# Patient Record
Sex: Male | Born: 1950
Health system: Southern US, Community
[De-identification: ages and names within clinical notes are randomized; demographics above are authoritative.]

## PROBLEM LIST (undated history)

## (undated) DIAGNOSIS — A150 Tuberculosis of lung: Secondary | ICD-10-CM

## (undated) DIAGNOSIS — I10 Essential (primary) hypertension: Secondary | ICD-10-CM

## (undated) HISTORY — DX: Tuberculosis of lung: A15.0

## (undated) HISTORY — DX: Essential (primary) hypertension: I10

---

## 2000-06-23 ENCOUNTER — Emergency Department (HOSPITAL_COMMUNITY): Admission: EM | Admit: 2000-06-23 | Discharge: 2000-06-24 | Payer: Self-pay

## 2000-06-24 ENCOUNTER — Encounter: Payer: Self-pay | Admitting: Emergency Medicine

## 2008-03-14 DIAGNOSIS — A150 Tuberculosis of lung: Secondary | ICD-10-CM

## 2008-03-14 HISTORY — DX: Tuberculosis of lung: A15.0

## 2012-03-07 ENCOUNTER — Ambulatory Visit (INDEPENDENT_AMBULATORY_CARE_PROVIDER_SITE_OTHER): Payer: BC Managed Care – PPO | Admitting: Family Medicine

## 2012-03-07 VITALS — BP 114/71 | HR 66 | Temp 98.6°F | Resp 18 | Ht 62.75 in | Wt 116.0 lb

## 2012-03-07 DIAGNOSIS — R5383 Other fatigue: Secondary | ICD-10-CM

## 2012-03-07 LAB — POCT CBC
Granulocyte percent: 51.1 %G (ref 37–80)
HCT, POC: 43.2 % — AB (ref 43.5–53.7)
Hemoglobin: 13.8 g/dL — AB (ref 14.1–18.1)
Lymph, poc: 3.3 (ref 0.6–3.4)
MCH, POC: 26.4 pg — AB (ref 27–31.2)
MCHC: 31.9 g/dL (ref 31.8–35.4)
MCV: 82.6 fL (ref 80–97)
MID (cbc): 0.5 (ref 0–0.9)
MPV: 10.1 fL (ref 0–99.8)
POC Granulocyte: 4 (ref 2–6.9)
POC LYMPH PERCENT: 42.3 %L (ref 10–50)
POC MID %: 6.6 %M (ref 0–12)
Platelet Count, POC: 246 10*3/uL (ref 142–424)
RBC: 5.23 M/uL (ref 4.69–6.13)
RDW, POC: 14 %
WBC: 7.9 10*3/uL (ref 4.6–10.2)

## 2012-03-07 LAB — POCT GLYCOSYLATED HEMOGLOBIN (HGB A1C): Hemoglobin A1C: 5.5

## 2012-03-07 NOTE — Progress Notes (Signed)
Patient Name: Jorge Richards Date of Birth: [birth year] Medical Record Number: [id number] Gender: male Date of Encounter: 03/07/2012  History of Present Illness:  Jorge Richards is a 60 y.o. very pleasant male patient who presents with the following:  Last seen here about a year ago, at which time he was noted to be non- compliant with his HTN medications, and also to be a heavy drinker and smoker. He has noted fatigue and low- energy for about 3 weeks.  No any other symptoms such as fever, ST, etc. He has not been losing weight.  He is not taking his BP medication right now. No blood in stool or melena.  He has never had a colonoscopy.   Not much appetite.  He does not feel sad or depressed  He has actually quit drinking since his last visit here!  He does continue to smoke.  Here today with his daughter who helped with interpretation.   There is no problem list on file for this patient.  No past medical history on file. No past surgical history on file. History  Substance Use Topics  . Smoking status: Current Everyday Smoker  . Smokeless tobacco: Not on file  . Alcohol Use: Not on file   No family history on file. No Known Allergies  Medication list has been reviewed and updated.  Prior to Admission medications   Not on File    Review of Systems:  As per HPI- otherwise negative.   Physical Examination: Filed Vitals:   03/07/12 0924  BP: 114/71  Pulse: 66  Temp: 98.6 F (37 C)  Resp: 18   Filed Vitals:   03/07/12 0924  Height: 5' 2.75" (1.594 m)  Weight: 116 lb (52.617 kg)   Body mass index is 20.71 kg/(m^2). Ideal Body Weight: Weight in (lb) to have BMI = 25: 139.7   GEN: WDWN, NAD, Non-toxic, Alert and responsive to questions, thin build HEENT: Atraumatic, Normocephalic. Neck supple. No masses, No LAD.  TM and oropharynx wnl Ears and Nose: No external deformity. CV: RRR, No M/G/R. No JVD. No thrill. No extra heart sounds. PULM: CTA B, no wheezes,  crackles, rhonchi. No retractions. No resp. distress. No accessory muscle use. ABD: S, NT, ND, +BS. No rebound. No HSM. EXTR: No c/c/e NEURO Normal gait.  PSYCH: Normally interactive.  Not depressed or anxious appearing.  Calm demeanor.  Results for orders placed in visit on 03/07/12  POCT CBC      Component Value Range   WBC 7.9  4.6 - 10.2 K/uL   Lymph, poc 3.3  0.6 - 3.4   POC LYMPH PERCENT 42.3  10 - 50 %L   MID (cbc) 0.5  0 - 0.9   POC MID % 6.6  0 - 12 %M   POC Granulocyte 4.0  2 - 6.9   Granulocyte percent 51.1  37 - 80 %G   RBC 5.23  4.69 - 6.13 M/uL   Hemoglobin 13.8 (*) 14.1 - 18.1 g/dL   HCT, POC 40.9 (*) 81.1 - 53.7 %   MCV 82.6  80 - 97 fL   MCH, POC 26.4 (*) 27 - 31.2 pg   MCHC 31.9  31.8 - 35.4 g/dL   RDW, POC 91.4     Platelet Count, POC 246  142 - 424 K/uL   MPV 10.1  0 - 99.8 fL  POCT GLYCOSYLATED HEMOGLOBIN (HGB A1C)      Component Value Range   Hemoglobin A1C 5.5  Assessment and Plan: 1. Fatigue  POCT CBC, POCT glycosylated hemoglobin (Hb A1C), Comprehensive metabolic panel, PSA, TSH, Vitamin D, 25-hydroxy   Await other labs as above.  BP is certainly now normal, so we can continue to hold his HTN medication.  Again encouraged him to get a screening colonoscopy and gave community resources.  Pointed out that anemia can be a warning sign of colon cancer. Will plan further follow- up pending labs. congratulated him on giving up alcohol- he is very pleased about this.   Abbe Amsterdam, MD

## 2012-03-08 ENCOUNTER — Encounter: Payer: Self-pay | Admitting: Family Medicine

## 2012-03-08 LAB — COMPREHENSIVE METABOLIC PANEL
ALT: 12 U/L (ref 0–53)
AST: 16 U/L (ref 0–37)
Albumin: 4.4 g/dL (ref 3.5–5.2)
Alkaline Phosphatase: 52 U/L (ref 39–117)
BUN: 13 mg/dL (ref 6–23)
CO2: 27 mEq/L (ref 19–32)
Calcium: 9.3 mg/dL (ref 8.4–10.5)
Chloride: 107 mEq/L (ref 96–112)
Creat: 0.99 mg/dL (ref 0.50–1.35)
Glucose, Bld: 87 mg/dL (ref 70–99)
Potassium: 4.5 mEq/L (ref 3.5–5.3)
Sodium: 141 mEq/L (ref 135–145)
Total Bilirubin: 0.5 mg/dL (ref 0.3–1.2)
Total Protein: 7 g/dL (ref 6.0–8.3)

## 2012-03-08 LAB — PSA: PSA: 0.34 ng/mL (ref <=4.00)

## 2012-03-08 LAB — TSH: TSH: 1.394 u[IU]/mL (ref 0.350–4.500)

## 2012-03-08 LAB — VITAMIN D 25 HYDROXY (VIT D DEFICIENCY, FRACTURES): Vit D, 25-Hydroxy: 24 ng/mL — ABNORMAL LOW (ref 30–89)

## 2012-05-20 ENCOUNTER — Ambulatory Visit (INDEPENDENT_AMBULATORY_CARE_PROVIDER_SITE_OTHER): Payer: BC Managed Care – PPO | Admitting: Family Medicine

## 2012-05-20 VITALS — BP 130/82 | HR 65 | Temp 98.1°F | Resp 16 | Ht 62.5 in | Wt 115.0 lb

## 2012-05-20 DIAGNOSIS — R002 Palpitations: Secondary | ICD-10-CM

## 2012-05-20 DIAGNOSIS — Z0000 Encounter for general adult medical examination without abnormal findings: Secondary | ICD-10-CM

## 2012-05-20 DIAGNOSIS — N400 Enlarged prostate without lower urinary tract symptoms: Secondary | ICD-10-CM

## 2012-05-20 LAB — LIPID PANEL
Cholesterol: 175 mg/dL (ref 0–200)
HDL: 55 mg/dL (ref >39)
LDL Cholesterol: 106 mg/dL — ABNORMAL HIGH (ref 0–99)
Total CHOL/HDL Ratio: 3.2 Ratio
Triglycerides: 71 mg/dL (ref <150)
VLDL: 14 mg/dL (ref 0–40)

## 2012-05-20 LAB — IFOBT (OCCULT BLOOD): IFOBT: NEGATIVE

## 2012-05-20 NOTE — Progress Notes (Signed)
History: 61 year old gentleman who works at [employer] who is here for his physical examination. He has no major acute medical complaint plates. He is aware some palpitations at nighttime when he lies down. He was here in June and had some of his labs at that time. His company requires and annual physical examination with every five-year lipid exam and a fecal occult blood testing annually.  Medications: None Current medication allergies: None Has history: Unremarkable Past surgical history: None   Family history: Parents are deceased. Some of his daughters are still in [place].  Social history: Married, lives with his wife. He has been in the [place] since the 1970s when he had to flee his home country. To settling [place] about 30 years ago. He has a job as a Psychologist, occupational. He still smokes one pack cigarettes a day. Denies alcohol use.  Review of systems: Constitutional: Says he has lost about 5 pounds, and his appetite is not what it used to be. HEENT: Negative Eyes: Negative. Wears glasses Respiratory: Unremarkable Cardiovascular: Palpitations when he lies down at night. Otherwise asymptomatic. No chest pain. GI: Unremarkable GU: Unremarkable Musculoskeletal: Unremarkable Dermatologic: Unremarkable Neurologic: Unremarkable Hematologic: Unremarkable Psychiatric: Unremarkable Endocrine: Unremarkable   Physical examination: Well-developed well-nourished man in no acute distress. HEENT normal. Eyes PERRLA. EOMs intact. Throat clear. Neck supple without significant nodes. No carotid bruits. Chest clear. Heart regular without murmurs hurt a single early beat.. Abdomen soft without masses tenderness. Normal male external genitalia. Uncircumcised. Testes. Digital rectal exam reveals prostate gland to be a little bit large, more on the left than on the right. Extremities unremarkable. Skin unremarkable.  Assessment: Normal physical examination Mild BPH Palpitations  Plan: Reviewed  his labs from June Check Hemoccult Check EKG  Pvc's, otherwise okay

## 2012-05-20 NOTE — Patient Instructions (Signed)
Normal physical examination. No medications needed at this time. If you feel like you're continuing to lose weight please return.  Quit smoking as discussed.

## 2012-05-21 ENCOUNTER — Encounter: Payer: Self-pay | Admitting: Radiology

## 2013-06-05 ENCOUNTER — Ambulatory Visit (INDEPENDENT_AMBULATORY_CARE_PROVIDER_SITE_OTHER): Payer: BC Managed Care – PPO | Admitting: Physician Assistant

## 2013-06-05 VITALS — BP 169/86 | HR 61 | Temp 98.0°F | Resp 18 | Ht 63.5 in | Wt 124.0 lb

## 2013-06-05 DIAGNOSIS — Z1211 Encounter for screening for malignant neoplasm of colon: Secondary | ICD-10-CM

## 2013-06-05 DIAGNOSIS — Z23 Encounter for immunization: Secondary | ICD-10-CM

## 2013-06-05 DIAGNOSIS — Z125 Encounter for screening for malignant neoplasm of prostate: Secondary | ICD-10-CM

## 2013-06-05 DIAGNOSIS — Z0000 Encounter for general adult medical examination without abnormal findings: Secondary | ICD-10-CM

## 2013-06-05 DIAGNOSIS — Z8611 Personal history of tuberculosis: Secondary | ICD-10-CM

## 2013-06-05 LAB — POCT CBC
Granulocyte percent: 50.8 %G (ref 37–80)
HCT, POC: 39.9 % — AB (ref 43.5–53.7)
Hemoglobin: 12.8 g/dL — AB (ref 14.1–18.1)
Lymph, poc: 3.4 (ref 0.6–3.4)
MCH, POC: 27.5 pg (ref 27–31.2)
MCHC: 32.1 g/dL (ref 31.8–35.4)
MCV: 85.6 fL (ref 80–97)
MID (cbc): 0.7 (ref 0–0.9)
MPV: 9 fL (ref 0–99.8)
POC Granulocyte: 4.2 (ref 2–6.9)
POC LYMPH PERCENT: 41.2 %L (ref 10–50)
POC MID %: 8 %M (ref 0–12)
Platelet Count, POC: 246 10*3/uL (ref 142–424)
RBC: 4.66 M/uL — AB (ref 4.69–6.13)
RDW, POC: 14.6 %
WBC: 8.3 10*3/uL (ref 4.6–10.2)

## 2013-06-05 NOTE — Patient Instructions (Signed)

## 2013-06-05 NOTE — Progress Notes (Signed)
Subjective:    Patient ID: Jorge Richards, male    DOB: 03-31-1951, 62 y.o.   MRN: [id number]  HPI This 62 y.o. male presents for Annual Wellness Exam.  Colonoscopy several years ago with Dr. Loreta Ave. Formerly smoked and drank alcohol, but has quit both. CMET, PSA and lipids were normal 04/27/2011. Mildly anemic then (Hgb 13.6, Hct 42.5), normal in 09/2008.   Past Medical History  Diagnosis Date   TB s/p INH 03/2008  . Hypertension     History reviewed. No pertinent past surgical history.  Prior to Admission medications   Not on File    No Known Allergies  History   Social History  . Marital Status: Married    Spouse Name: Phone Hedglin    Number of Children: 3  . Years of Education: 12   Occupational History  . 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 Smoking status: Former Games developer  . Smokeless tobacco: Never Used  . Alcohol Use: No  . Drug Use: No  . Sexual Activity: Yes    Partners: Female   Other Topics Concern  . Not on file   Social History Narrative   Originally from [place].  Came to the [place] in 1982.  Lives with his wife and a daughter.  Another daughter lives in [place], [place].  Their son lives in [place].    History reviewed. No pertinent family history.    Review of Systems  Constitutional: Negative.   HENT: Positive for neck pain (occasionally, when he does work in the garden and spends a lot of time leaning over). Negative for hearing loss, ear pain, nosebleeds, congestion, sore throat, facial swelling, rhinorrhea, sneezing, drooling, mouth sores, trouble swallowing, neck stiffness, dental problem, voice change, postnasal drip, sinus pressure, tinnitus and ear discharge.   Eyes: Negative.   Respiratory: Negative.   Cardiovascular: Negative.   Gastrointestinal: Negative.   Endocrine: Negative.   Genitourinary: Negative.   Skin: Negative.   Allergic/Immunologic: Negative.   Neurological: Negative.   Psychiatric/Behavioral:  Negative.        Objective:   Physical Exam  Vitals reviewed. Constitutional: He is oriented to person, place, and time. Vital signs are normal. He appears well-developed and well-nourished. He is active and cooperative.  Non-toxic appearance. He does not have a sickly appearance. He does not appear ill. No distress.  HENT:  Head: Normocephalic and atraumatic.  Right Ear: Hearing, tympanic membrane, external ear and ear canal normal.  Left Ear: Hearing, tympanic membrane, external ear and ear canal normal.  Nose: Nose normal.  Mouth/Throat: Uvula is midline, oropharynx is clear and moist and mucous membranes are normal. He does not have dentures. No oral lesions. No trismus in the jaw. Normal dentition. No dental abscesses, edematous, lacerations or dental caries.  Eyes: Conjunctivae, EOM and lids are normal. Pupils are equal, round, and reactive to light. Right eye exhibits no discharge. Left eye exhibits no discharge. No scleral icterus.  Fundoscopic exam:      The right eye shows no arteriolar narrowing, no AV nicking, no exudate, no hemorrhage and no papilledema.       The left eye shows no arteriolar narrowing, no AV nicking, no exudate, no hemorrhage and no papilledema.  Neck: Normal range of motion, full passive range of motion without pain and phonation normal. Neck supple. No spinous process tenderness and no muscular tenderness present. No rigidity. No tracheal deviation, no edema, no erythema and normal range of motion present.  No thyromegaly present.  Cardiovascular: Normal rate, regular rhythm, S1 normal, S2 normal, normal heart sounds, intact distal pulses and normal pulses.  Exam reveals no gallop and no friction rub.   No murmur heard. Pulmonary/Chest: Effort normal and breath sounds normal. No respiratory distress. He has no wheezes. He has no rales.  Abdominal: Soft. Normal appearance and bowel sounds are normal. He exhibits no distension and no mass. There is no  hepatosplenomegaly. There is no tenderness. There is no rebound and no guarding. No hernia. Hernia confirmed negative in the right inguinal area and confirmed negative in the left inguinal area.  Genitourinary: Rectum normal, prostate normal, testes normal and penis normal. Guaiac negative stool. Uncircumcised. No phimosis, paraphimosis, hypospadias, penile erythema or penile tenderness. No discharge found.  Musculoskeletal: Normal range of motion. He exhibits no edema and no tenderness.       Right shoulder: Normal.       Left shoulder: Normal.       Right elbow: Normal.      Left elbow: Normal.       Right wrist: Normal.       Left wrist: Normal.       Right hip: Normal.       Left hip: Normal.       Right knee: Normal.       Left knee: Normal.       Right ankle: Normal. Achilles tendon normal.       Left ankle: Normal. Achilles tendon normal.       Cervical back: Normal. He exhibits normal range of motion, no tenderness, no bony tenderness, no swelling, no edema, no deformity, no laceration, no pain, no spasm and normal pulse.       Thoracic back: Normal.       Lumbar back: Normal.       Right upper arm: Normal.       Left upper arm: Normal.       Right forearm: Normal.       Left forearm: Normal.       Right hand: Normal.       Left hand: Normal.       Right upper leg: Normal.       Left upper leg: Normal.       Right lower leg: Normal.       Left lower leg: Normal.       Right foot: Normal.       Left foot: Normal.  Lymphadenopathy:       Head (right side): No submental, no submandibular, no tonsillar, no preauricular, no posterior auricular and no occipital adenopathy present.       Head (left side): No submental, no submandibular, no tonsillar, no preauricular, no posterior auricular and no occipital adenopathy present.    He has no cervical adenopathy.       Right: No inguinal and no supraclavicular adenopathy present.       Left: No inguinal and no supraclavicular  adenopathy present.  Neurological: He is alert and oriented to person, place, and time. He has normal strength and normal reflexes. He displays no tremor. No cranial nerve deficit. He exhibits normal muscle tone. Coordination and gait normal.  Skin: Skin is warm, dry and intact. No abrasion, no ecchymosis, no laceration, no lesion and no rash noted. He is not diaphoretic. No cyanosis or erythema. No pallor. Nails show no clubbing.  Psychiatric: He has a normal mood and affect. His speech is normal and behavior is  normal. Judgment and thought content normal. Cognition and memory are normal.    Results for orders placed in visit on 06/05/13  POCT CBC      Result Value Range   WBC 8.3  4.6 - 10.2 K/uL   Lymph, poc 3.4  0.6 - 3.4   POC LYMPH PERCENT 41.2  10 - 50 %L   MID (cbc) 0.7  0 - 0.9   POC MID % 8.0  0 - 12 %M   POC Granulocyte 4.2  2 - 6.9   Granulocyte percent 50.8  37 - 80 %G   RBC 4.66 (*) 4.69 - 6.13 M/uL   Hemoglobin 12.8 (*) 14.1 - 18.1 g/dL   HCT, POC 10.2 (*) 72.5 - 53.7 %   MCV 85.6  80 - 97 fL   MCH, POC 27.5  27 - 31.2 pg   MCHC 32.1  31.8 - 35.4 g/dL   RDW, POC 36.6     Platelet Count, POC 246  142 - 424 K/uL   MPV 9.0  0 - 99.8 fL  IFOBT (OCCULT BLOOD)      Result Value Range   IFOBT Negative         Assessment & Plan:  Routine general medical examination at a health care facility - Plan: POCT CBC, Comprehensive metabolic panel; Age appropriate anticipatory guidance provided.  Need for influenza vaccination - Plan: Flu Vaccine QUAD 36+ mos IM  Screening for colon cancer - Plan: IFOBT POC (occult bld, rslt in office)  Screening for prostate cancer - Plan: PSA  BP is elevated today off HCTZ.  Needs to monitor.  If it is routinely >140/90, will advise he resume treatment.  Await remaining labs.  May need additional evaluation of the anemia, though recent colonoscopy is reassuring.  Fernande Bras, PA-C Physician Assistant-Certified [facility]

## 2013-06-06 LAB — COMPREHENSIVE METABOLIC PANEL
ALT: 15 U/L (ref 0–53)
AST: 16 U/L (ref 0–37)
CO2: 29 mEq/L (ref 19–32)
Calcium: 9.1 mg/dL (ref 8.4–10.5)
Chloride: 106 mEq/L (ref 96–112)
Glucose, Bld: 86 mg/dL (ref 70–99)
Sodium: 140 mEq/L (ref 135–145)
Total Bilirubin: 0.4 mg/dL (ref 0.3–1.2)
Total Protein: 6.9 g/dL (ref 6.0–8.3)

## 2013-06-06 LAB — PSA: PSA: 0.34 ng/mL (ref <=4.00)

## 2013-06-07 ENCOUNTER — Encounter: Payer: Self-pay | Admitting: Physician Assistant

## 2014-06-25 ENCOUNTER — Ambulatory Visit (INDEPENDENT_AMBULATORY_CARE_PROVIDER_SITE_OTHER): Payer: BC Managed Care – PPO

## 2014-06-25 ENCOUNTER — Ambulatory Visit (INDEPENDENT_AMBULATORY_CARE_PROVIDER_SITE_OTHER): Payer: BC Managed Care – PPO | Admitting: Family Medicine

## 2014-06-25 VITALS — BP 128/70 | HR 72 | Temp 98.7°F | Resp 16 | Ht 62.75 in | Wt 125.2 lb

## 2014-06-25 DIAGNOSIS — S43421A Sprain of right rotator cuff capsule, initial encounter: Secondary | ICD-10-CM

## 2014-06-25 DIAGNOSIS — M25511 Pain in right shoulder: Secondary | ICD-10-CM

## 2014-06-25 MED ORDER — HYDROCODONE-ACETAMINOPHEN 5-325 MG PO TABS: 1.0000 | ORAL_TABLET | Freq: Four times a day (QID) | ORAL | Status: DC | PRN

## 2014-06-25 MED ORDER — MELOXICAM 7.5 MG PO TABS: 7.5000 mg | ORAL_TABLET | Freq: Every day | ORAL | Status: DC

## 2014-06-25 NOTE — Progress Notes (Signed)
   Subjective:    Patient ID: Jorge Richards, male    DOB: 12/16/1950, 63 y.o.   MRN: [id number]  Shoulder Pain     GUEST, Loretha StaplerHRIS WARREN, MD  Chief Complaint  Patient presents with  . Shoulder Pain    Right shoulder. 2-3 days. Pt. states it's dull and achy pain. It keeps it up at night.    Patient Active Problem List   Diagnosis Date Noted  . History of TB (tuberculosis)      Prior to Admission medications   Not on File   Medications, allergies, past medical history, surgical history, family history, social history and problem list reviewed and updated.  63 yom with no significant PMH presents with right shoulder pain. Started 2-3 days ago. First noticed pain at front of right shoulder. Cannot remember what he was doing when the pain actually presented, no acute injury he can recall. Pain has been steady since it came on. Painful to move his arm at all. Pain has steadily been 7/10 though sometimes it is 10/10 when he tries to sleep at night. Tylenol has helped a little bit. Left shoulder is fine.   Review of Systems  No chest pain, SOB, HA, dizziness, vision change, N/V, diarrhea, constipation, dysuria, urinary urgency or frequency, myalgias, arthralgias or rash.     Objective:   Physical Exam  Constitutional: He appears well-developed and well-nourished.  Musculoskeletal:       Right shoulder: He exhibits decreased range of motion, tenderness, bony tenderness, pain and decreased strength.       Left shoulder: Normal. He exhibits normal range of motion, no tenderness, no bony tenderness, no swelling and no pain.  Right shoulder without lesions or rashes. TTP over anterior AC joint, bicepital groove. Decreased ROM in front and out to side due to pain. Unable to lift arm overhead due to pain. 5/5 strength at shoulder, elbow, and grip. Normal sensation. 2+ brachialis, radial pulses. Positive Neers test. Positive pain with resisted external rotation. Positive Speed's test. Negative  clunk test.    [facility] reading (PRIMARY) by  Dr. Patsy Lageropland. Findings: Negative.     Assessment & Plan:   [age] yom with no significant PMH presents complaining of 2-3 day history of right shoulder pain.   Shoulder pain, acute, right - Plan: DG Shoulder Right, meloxicam (MOBIC) 7.5 MG tablet, HYDROcodone-acetaminophen (NORCO) 5-325 MG per tablet  Rotator cuff (capsule) sprain, right, initial encounter  -- XR negative for acute abnormality. -- Most likely rotator cuff sprain/tendonitis.  -- Mobic for inflammation/pain -- Norco for pain -- Patient placed in right shoulder sling, instructions given.  -- Note given to stay off work until end of week. -- Ice  -- Patient instructed to return to office or contact office if symptoms aren't improving in 2-3 days. Donnajean Lopes.   Fidelia Cathers M. Shlok Raz, PA-C Physician Assistant-Certified [facility]  06/25/2014 3:15 PM

## 2014-06-25 NOTE — Patient Instructions (Signed)
Your shoulder xray was negative for anything wrong with the bones.  You most likely sprained your rotator cuff muscles.  You will need to rest your shoulder and be off work for 3 days.  You should keep your splint on for the next 3-4 days as much as possible. It is ok to remove it as needed to shower, etc.  Take your mobic as prescribed (7.5 mg once daily) for inflammation and pain.  Take your norco as needed for pain. You can take one tablet every 6 hours as needed for pain. If symptoms aren't improved in the next few days, please return to clinic or call us at the clinic so we can refer you to an orthopedic shoulder specialist.

## 2014-06-27 ENCOUNTER — Ambulatory Visit (INDEPENDENT_AMBULATORY_CARE_PROVIDER_SITE_OTHER): Payer: BC Managed Care – PPO

## 2014-06-27 ENCOUNTER — Ambulatory Visit (INDEPENDENT_AMBULATORY_CARE_PROVIDER_SITE_OTHER): Payer: BC Managed Care – PPO | Admitting: Emergency Medicine

## 2014-06-27 VITALS — BP 148/88 | HR 88 | Temp 98.3°F | Resp 16 | Ht 63.0 in | Wt 124.6 lb

## 2014-06-27 DIAGNOSIS — I10 Essential (primary) hypertension: Secondary | ICD-10-CM

## 2014-06-27 DIAGNOSIS — I459 Conduction disorder, unspecified: Secondary | ICD-10-CM

## 2014-06-27 DIAGNOSIS — Z13220 Encounter for screening for lipoid disorders: Secondary | ICD-10-CM

## 2014-06-27 DIAGNOSIS — Z125 Encounter for screening for malignant neoplasm of prostate: Secondary | ICD-10-CM

## 2014-06-27 DIAGNOSIS — Z0000 Encounter for general adult medical examination without abnormal findings: Secondary | ICD-10-CM

## 2014-06-27 LAB — POCT CBC
Granulocyte percent: 60.1 %G (ref 37–80)
HCT, POC: 42 % — AB (ref 43.5–53.7)
Hemoglobin: 13.7 g/dL — AB (ref 14.1–18.1)
Lymph, poc: 3.7 — AB (ref 0.6–3.4)
MCH: 27.2 pg (ref 27–31.2)
MCHC: 32.6 g/dL (ref 31.8–35.4)
MCV: 83.4 fL (ref 80–97)
MID (cbc): 0.3 (ref 0–0.9)
MPV: 7.7 fL (ref 0–99.8)
POC Granulocyte: 6 (ref 2–6.9)
POC LYMPH PERCENT: 36.5 %L (ref 10–50)
POC MID %: 3.4 %M (ref 0–12)
Platelet Count, POC: 291 10*3/uL (ref 142–424)
RBC: 5.04 M/uL (ref 4.69–6.13)
RDW, POC: 13.9 %
WBC: 10 10*3/uL (ref 4.6–10.2)

## 2014-06-27 LAB — POCT URINALYSIS DIPSTICK
Bilirubin, UA: NEGATIVE
Blood, UA: NEGATIVE
GLUCOSE UA: NEGATIVE
KETONES UA: NEGATIVE
Nitrite, UA: NEGATIVE
Protein, UA: NEGATIVE
Urobilinogen, UA: 0.2
pH, UA: 6

## 2014-06-27 LAB — IFOBT (OCCULT BLOOD): IFOBT: NEGATIVE

## 2014-06-27 MED ORDER — LOSARTAN POTASSIUM 25 MG PO TABS: 25.0000 mg | ORAL_TABLET | Freq: Every day | ORAL | Status: DC

## 2014-06-27 NOTE — Patient Instructions (Signed)

## 2014-06-27 NOTE — Progress Notes (Addendum)
Subjective:    Patient ID: Jorge Richards, male    DOB: 04/26/1951, 63 y.o.   MRN: [id number]  This chart was scribed for Collene GobbleSteven A Ann Bohne, MD by Tonye RoyaltyJoshua Chen, ED Scribe. This patient was seen in room 11 and the patient's care was started at 12:43 PM.   HPI  HPI Comments: Jorge Richards is a 63 y.o. male who presents to the [facility] for physical exam for work. He reports recently having an arm injury for which he is out of work. He denies significant medical history except for prior hypertension, for which he used to be medicated but longer needs it, though he reports high blood pressure here today. He states he has not had a colonoscopy and does not want to have one this year. He states he stopped smoking 1 year ago.  Review of Systems  Constitutional: Negative for fever and chills.  Gastrointestinal: Negative for nausea and vomiting.  Musculoskeletal:       Arm pain       Objective:   Physical Exam CONSTITUTIONAL: Well developed/well nourished HEAD: Normocephalic/atraumatic EYES: EOMI/PERRL ENMT: Mucous membranes moist, Missing multiple teeth NECK: supple no meningeal signs SPINE:entire spine nontender CV: S1/S2 noted, no murmurs/rubs/gallops noted, rhythm is irregular LUNGS: no apparent distress, decreased breath sounds in bases with occasional rhonchi ABDOMEN: soft, nontender, no rebound or guarding GU:no cva tenderness NEURO: Pt is awake/alert, moves all extremitiesx4 EXTREMITIES: pulses normal, full ROM SKIN: warm, color normal PSYCH: no abnormalities of mood noted RECTAL: prostate normal size, no masses or lumps [facility] reading (PRIMARY) by  Dr.Gabryela Kimbrell no acute disease Results for orders placed in visit on 06/27/14  POCT CBC      Result Value Ref Range   WBC 10.0  4.6 - 10.2 K/uL   Lymph, poc 3.7 (*) 0.6 - 3.4   POC LYMPH PERCENT 36.5  10 - 50 %L   MID (cbc) 0.3  0 - 0.9   POC MID % 3.4  0 - 12 %M   POC Granulocyte 6.0  2 - 6.9   Granulocyte percent  60.1  37 - 80 %G   RBC 5.04  4.69 - 6.13 M/uL   Hemoglobin 13.7 (*) 14.1 - 18.1 g/dL   HCT, POC 78.242.0 (*) 95.643.5 - 53.7 %   MCV 83.4  80 - 97 fL   MCH, POC 27.2  27 - 31.2 pg   MCHC 32.6  31.8 - 35.4 g/dL   RDW, POC 21.313.9     Platelet Count, POC 291  142 - 424 K/uL   MPV 7.7  0 - 99.8 fL  POCT URINALYSIS DIPSTICK      Result Value Ref Range   Color, UA light yellow     Clarity, UA clear     Glucose, UA neg     Bilirubin, UA neg     Ketones, UA neg     Spec Grav, UA <=1.005     Blood, UA neg     pH, UA 6.0     Protein, UA neg     Urobilinogen, UA 0.2     Nitrite, UA neg     Leukocytes, UA Trace    EKG sinus bradycardia rate 54 no acute change     Assessment & Plan:  Blood pressure is up today. Routine labs were done. Will try losartan 25 mg one a day. Patient has already quit smoking. I did not see any suspicious areas on chest x-ray. His EKG  showed no acute changes or arrhythmia

## 2014-06-28 LAB — COMPREHENSIVE METABOLIC PANEL
ALBUMIN: 4.4 g/dL (ref 3.5–5.2)
ALT: 15 U/L (ref 0–53)
AST: 19 U/L (ref 0–37)
Alkaline Phosphatase: 51 U/L (ref 39–117)
BUN: 17 mg/dL (ref 6–23)
CALCIUM: 9.6 mg/dL (ref 8.4–10.5)
CHLORIDE: 102 meq/L (ref 96–112)
CO2: 30 meq/L (ref 19–32)
Creat: 0.87 mg/dL (ref 0.50–1.35)
Glucose, Bld: 85 mg/dL (ref 70–99)
POTASSIUM: 4.7 meq/L (ref 3.5–5.3)
SODIUM: 139 meq/L (ref 135–145)
TOTAL PROTEIN: 7.6 g/dL (ref 6.0–8.3)
Total Bilirubin: 0.4 mg/dL (ref 0.2–1.2)

## 2014-06-28 LAB — LIPID PANEL
CHOLESTEROL: 201 mg/dL — AB (ref 0–200)
HDL: 64 mg/dL (ref >39)
LDL CALC: 110 mg/dL — AB (ref 0–99)
TRIGLYCERIDES: 133 mg/dL (ref <150)
Total CHOL/HDL Ratio: 3.1 Ratio
VLDL: 27 mg/dL (ref 0–40)

## 2014-06-28 LAB — PSA: PSA: 0.45 ng/mL (ref <=4.00)

## 2014-06-28 NOTE — Progress Notes (Signed)
Attempted call, voicemail not set up 

## 2014-06-29 LAB — IFOBT (OCCULT BLOOD): IFOBT: NEGATIVE

## 2014-06-29 NOTE — Addendum Note (Signed)
Addended by: Johnnette LitterARDWELL, Sukaina Toothaker M on: 06/29/2014 01:19 PM   Modules accepted: Orders

## 2014-08-15 ENCOUNTER — Ambulatory Visit (INDEPENDENT_AMBULATORY_CARE_PROVIDER_SITE_OTHER): Payer: BC Managed Care – PPO | Admitting: Emergency Medicine

## 2014-08-15 VITALS — BP 142/86 | HR 84 | Temp 98.4°F | Resp 16 | Ht 62.5 in | Wt 124.8 lb

## 2014-08-15 DIAGNOSIS — M25511 Pain in right shoulder: Secondary | ICD-10-CM

## 2014-08-15 DIAGNOSIS — L989 Disorder of the skin and subcutaneous tissue, unspecified: Secondary | ICD-10-CM

## 2014-08-15 DIAGNOSIS — M7550 Bursitis of unspecified shoulder: Secondary | ICD-10-CM | POA: Insufficient documentation

## 2014-08-15 DIAGNOSIS — M7551 Bursitis of right shoulder: Secondary | ICD-10-CM

## 2014-08-15 MED ORDER — HYDROCODONE-ACETAMINOPHEN 5-325 MG PO TABS: 1.0000 | ORAL_TABLET | Freq: Four times a day (QID) | ORAL | Status: DC | PRN

## 2014-08-15 MED ORDER — MELOXICAM 7.5 MG PO TABS: 7.5000 mg | ORAL_TABLET | Freq: Every day | ORAL | Status: DC

## 2014-08-15 NOTE — Progress Notes (Addendum)
Subjective:  This chart was scribed for Jorge SpareSteven A. Cleta Albertsaub, MD by Jorge Richards, Medical scribe. This patient was seen in ROOM 10 and the patient's care was started 1:14 PM.   Patient ID: Jorge Richards States, male    DOB: 04/18/1951, 63 y.o.   MRN: [id number]   Chief Complaint  Patient presents with  . Follow-up    R shoulder pain DOS: 06/25/14 - [place]  . Shoulder Pain    Left x 2 wks  Welder x 15 yrs    HPI HPI Comments: Jorge Richards is a 63 y.o. male who [facility] of recurrent right shoulder pain. Pt is here for a follow up, he was seen on 06/25/14 by Dr. Patsy Lageropland. His daughter reports he is having some minimal left shoulder pain now as well, but his right shoulder pain is still much worse. She states that he took a few days off work after his visit in October but since that time pt has been working. He reports he is not able to lift his right arm above his head for the last 2 months. He states he has difficulty getting his shirt on in the mornings. Pt denies neck pain.    Patient Active Problem List   Diagnosis Date Noted  . Essential hypertension, benign 06/27/2014  . History of TB (tuberculosis)    Past Medical History  Diagnosis Date  . Hypertension   . TB (pulmonary tuberculosis) 03/2008    s/p INH   Current Outpatient Prescriptions on File Prior to Visit  Medication Sig Dispense Refill  . losartan (COZAAR) 25 MG tablet Take 1 tablet (25 mg total) by mouth daily. 30 tablet 11  . HYDROcodone-acetaminophen (NORCO) 5-325 MG per tablet Take 1 tablet by mouth every 6 (six) hours as needed. (Patient not taking: Reported on 08/15/2014) 30 tablet 0  . meloxicam (MOBIC) 7.5 MG tablet Take 1 tablet (7.5 mg total) by mouth daily. (Patient not taking: Reported on 08/15/2014) 30 tablet 1   No current facility-administered medications on file prior to visit.   No Known Allergies   Review of Systems  Musculoskeletal: Positive for myalgias and arthralgias. Negative for neck  pain.     Objective:   Physical Exam  Constitutional: He is oriented to person, place, and time. He appears well-developed and well-nourished.  HENT:  Head: Normocephalic and atraumatic.  Neck: Normal range of motion. Neck supple. No tracheal deviation present.  Neck full ROM. No tenderness.   Cardiovascular: Normal rate.   Pulmonary/Chest: Effort normal. No respiratory distress.  Abdominal: He exhibits no distension.  Musculoskeletal:  Right shoulder exam he is only able to abduct 30 degrees. Unable to internal rotate the right shoulder. Left shoulder he is able to abduct fully and has normal ROM.   Neurological: He is alert and oriented to person, place, and time.  Skin: Skin is warm and dry.  There is a 1 x 1 cm pigmented lesion left side of the neck and a second 1 x 1 cm pigmented lesion mid back.  Psychiatric: He has a normal mood and affect. His behavior is normal.  Nursing note and vitals reviewed.   Filed Vitals:   08/15/14 1239  BP: 142/86  Pulse: 84  Temp: 98.4 F (36.9 C)  TempSrc: Oral  Resp: 16  Height: 5' 2.5" (1.588 m)  Weight: 124 lb 12.8 oz (56.609 kg)  SpO2: 100%  Procedure Note the subdeltoid area of the right shoulder was prepped with Betadine 2. The area  was noted with 1 mL of 2% plain. The subdeltoid space was injected with 40 of Kenalog with 2 mL of 2% plain. Patient tolerated the procedure well.     Assessment & Plan:  Patient has a pigmented lesion on the left side of the neck and mid back that I would like the dermatologist to check. He has a frozen right shoulder and referral will be made to orthopedics to help with this. Hopefully he will get some relief with the steroid injection.I personally performed the services described in this documentation, which was scribed in my presence. The recorded information has been reviewed and is accurate.

## 2014-09-19 ENCOUNTER — Telehealth: Payer: Self-pay

## 2014-09-19 ENCOUNTER — Other Ambulatory Visit: Payer: Self-pay | Admitting: Emergency Medicine

## 2014-09-19 DIAGNOSIS — M25511 Pain in right shoulder: Secondary | ICD-10-CM

## 2014-09-19 MED ORDER — HYDROCODONE-ACETAMINOPHEN 5-325 MG PO TABS: 1.0000 | ORAL_TABLET | Freq: Four times a day (QID) | ORAL | Status: DC | PRN

## 2014-09-19 NOTE — Telephone Encounter (Signed)
Patient is requesting a refill on hydrocodone. Please advise patient last seen 08/15/14 by Dr. Cleta Albertsaub.  Best:  [phone]

## 2014-09-20 NOTE — Telephone Encounter (Signed)
LMOM that Rx is ready. 

## 2014-12-05 ENCOUNTER — Ambulatory Visit (INDEPENDENT_AMBULATORY_CARE_PROVIDER_SITE_OTHER): Payer: BLUE CROSS/BLUE SHIELD | Admitting: Family Medicine

## 2014-12-05 VITALS — BP 108/82 | HR 77 | Temp 97.4°F | Resp 16 | Ht 63.5 in | Wt 128.0 lb

## 2014-12-05 DIAGNOSIS — M25511 Pain in right shoulder: Secondary | ICD-10-CM | POA: Diagnosis not present

## 2014-12-05 MED ORDER — MELOXICAM 7.5 MG PO TABS: 7.5000 mg | ORAL_TABLET | Freq: Every day | ORAL | Status: DC

## 2014-12-05 MED ORDER — HYDROCODONE-ACETAMINOPHEN 5-325 MG PO TABS: 1.0000 | ORAL_TABLET | Freq: Every evening | ORAL | Status: DC | PRN

## 2014-12-05 NOTE — Progress Notes (Signed)
[facility] [address], [place] [address] [phone]  Date:  12/05/2014   Name:  Jorge Richards   DOB:  05/01/1951   MRN:  [id number]  PCP:  Tally DueGUEST, CHRIS WARREN, MD    Chief Complaint: Shoulder Pain; Referral; and Medication Refill   History of Present Illness:  Jorge Richards is a 64 y.o. very pleasant male patient who presents with the following:  Seen here in October and in December with pain in his right shoulder.  At his most recent visit he was noted to have much reduced ROM, treated with a kenalog injection.  The kenalog did help but just for a little while.  He was referred to ortho but it seems that this did not happen.  Called [facility]- they had contacted pt but he did not get back ot hem He is not taking anything for pain right now. He is having so much pain at night that he cannot sleep some of the time He is otherwise generally in good health.  No medication allergies.   History of controlled HTN.   He has used mobic and norco in the past but these are run out  Patient Active Problem List   Diagnosis Date Noted  . Bursitis, shoulder 08/15/2014  . Essential hypertension, benign 06/27/2014  . History of TB (tuberculosis)     Past Medical History  Diagnosis Date  . Hypertension   . TB (pulmonary tuberculosis) 03/2008    s/p INH    No past surgical history on file.  History  Substance Use Topics  . Smoking status: Former Games developermoker  . Smokeless tobacco: Never Used  . Alcohol Use: No    No family history on file.  No Known Allergies  Medication list has been reviewed and updated.  Current Outpatient Prescriptions on File Prior to Visit  Medication Sig Dispense Refill  . HYDROcodone-acetaminophen (NORCO) 5-325 MG per tablet Take 1 tablet by mouth every 6 (six) hours as needed. 20 tablet 0  . losartan (COZAAR) 25 MG tablet Take 1 tablet (25 mg total) by mouth daily. 30 tablet 11  . meloxicam (MOBIC) 7.5 MG tablet Take 1 tablet  (7.5 mg total) by mouth daily. 20 tablet 1   No current facility-administered medications on file prior to visit.    Review of Systems:  As per HPI- otherwise negative.   Physical Examination: Filed Vitals:   12/05/14 0927  BP: 108/82  Pulse: 77  Temp: 97.4 F (36.3 C)  Resp: 16   Filed Vitals:   12/05/14 0927  Height: 5' 3.5" (1.613 m)  Weight: 128 lb (58.06 kg)   Body mass index is 22.32 kg/(m^2). Ideal Body Weight: Weight in (lb) to have BMI = 25: 143.1  GEN: WDWN, NAD, Non-toxic, A & O x 3, slim build, looks well.  Here with this daughter today.  His English is somewhat limited HEENT: Atraumatic, Normocephalic. Neck supple. No masses, No LAD. Ears and Nose: No external deformity. CV: RRR, No M/G/R. No JVD. No thrill. No extra heart sounds. PULM: CTA B, no wheezes, crackles, rhonchi. No retractions. No resp. distress. No accessory muscle use. EXTR: No c/c/e NEURO Normal gait.  PSYCH: Normally interactive. Conversant. Not depressed or anxious appearing.  Calm demeanor.  Left shoulder with normal ROM Right shoulder: flexion to approx 90, abduction to 75.  Limited internal and external rotation.  Normal BUE strength and sensation.  Tenderness over the anterior shoulder Weakness bilaterally with empty can test  Assessment and Plan: Shoulder pain, acute, right - Plan: HYDROcodone-acetaminophen (NORCO) 5-325 MG per tablet, meloxicam (MOBIC) 7.5 MG tablet  Persistent right shoulder pain. 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  He has some loss of ROM/ partially frozen shoulder at this point.  He did not get seen by ortho earlier; suspect language barrier contributed.  Made appt with Dr. Turner Daniels for 4/7; his daughter will come with him to interpret.  Refilled medications for pain, encouraged him to work on ROM as he is able   Signed Abbe Amsterdam, MD

## 2014-12-05 NOTE — Patient Instructions (Signed)
Use the mobic as needed during the day for right shoulder pain. Use the norco at night as needed- however remember that this medication is habit forming and will make you sleepy; use sparingly

## 2015-01-16 LAB — BASIC METABOLIC PANEL
BUN: 17 mg/dL (ref 4–21)
Creatinine: 1.1 mg/dL (ref 0.6–1.3)
GLUCOSE: 96 mg/dL
POTASSIUM: 6 mmol/L — AB (ref 3.4–5.3)
Sodium: 138 mmol/L (ref 137–147)

## 2015-01-16 LAB — CBC AND DIFFERENTIAL
HEMATOCRIT: 40 % — AB (ref 41–53)
HEMOGLOBIN: 13.2 g/dL — AB (ref 13.5–17.5)
Neutrophils Absolute: 3 /uL
Platelets: 311 10*3/uL (ref 150–399)
WBC: 8.2 10^3/mL

## 2015-01-16 LAB — HEMOGLOBIN A1C: Hgb A1c MFr Bld: 5.7 % (ref 4.0–6.0)

## 2015-01-16 LAB — HEPATIC FUNCTION PANEL
ALT: 34 U/L (ref 10–40)
AST: 27 U/L (ref 14–40)
Alkaline Phosphatase: 60 U/L (ref 25–125)
BILIRUBIN, TOTAL: 0.4 mg/dL

## 2015-01-16 LAB — TSH: TSH: 2.15 u[IU]/mL (ref 0.41–5.90)

## 2015-01-16 LAB — LIPID PANEL
Cholesterol: 214 mg/dL — AB (ref 0–200)
HDL: 76 mg/dL — AB (ref 35–70)
LDL Cholesterol: 119 mg/dL
Triglycerides: 93 mg/dL (ref 40–160)

## 2015-01-26 ENCOUNTER — Ambulatory Visit (INDEPENDENT_AMBULATORY_CARE_PROVIDER_SITE_OTHER): Payer: BLUE CROSS/BLUE SHIELD | Admitting: Physician Assistant

## 2015-01-26 VITALS — BP 150/90 | HR 84 | Temp 97.9°F | Ht 62.5 in | Wt 127.2 lb

## 2015-01-26 DIAGNOSIS — E875 Hyperkalemia: Secondary | ICD-10-CM

## 2015-01-26 NOTE — Patient Instructions (Addendum)
Take miralax twice daily at the over the counter dose, which is usually 17 grams per packet. Also, take metamucil 1 tablespoon morning with lots of water, and at night with lots of water.    STOP TAKING MELOXICAM.

## 2015-01-26 NOTE — Progress Notes (Signed)
01/26/2015 at 2:01 PM  Shermon Reesor / DOB: 07/03/1951 / MRN: [id number]  The patient has History of TB (tuberculosis); Essential hypertension, benign; and Bursitis, shoulder on his problem list.  SUBJECTIVE  Chief complaint: Abnormal Lab  Patient here because he had multiple labs run for his work and he had several abnormals and received a strongly worded letter stating he needs to come in for immediate evaluation. He works as a Psychologist, occupationalwelder and receives this as part of a Hydrologistyearly evalution.  The lab reports is scanned into media tab in epic and includes an elevated potassium.    He  has a past medical history of Hypertension and TB (pulmonary tuberculosis) (03/2008).    Medications reviewed and updated by myself where necessary, and exist elsewhere in the encounter.   Mr. Yolanda MangesMalay has No Known Allergies. He  reports that he has quit smoking. He has never used smokeless tobacco. He reports that he does not drink alcohol or use illicit drugs. He  reports that he currently engages in sexual activity and has had male partners. The patient  has no past surgical history on file.  His family history is not on file.  Review of Systems  Constitutional: Negative for fever.  Eyes: Negative.   Respiratory: Negative.   Cardiovascular: Negative.   Gastrointestinal: Positive for constipation. Negative for nausea.  Genitourinary: Negative for dysuria, urgency and frequency.  Musculoskeletal: Negative for myalgias, back pain and falls.  Skin: Negative for itching and rash.  Neurological: Negative for dizziness, tingling and headaches.    OBJECTIVE  His  height is 5' 2.5" (1.588 m) and weight is 127 lb 4 oz (57.72 kg). His oral temperature is 97.9 F (36.6 C). His blood pressure is 150/90 and his pulse is 84. His oxygen saturation is 98%.  The patient's body mass index is 22.89 kg/(m^2).  Physical Exam  Constitutional: He is oriented to person, place, and time. He appears well-developed and well-nourished.  No distress.  HENT:  Right Ear: Hearing, tympanic membrane, external ear and ear canal normal.  Left Ear: Hearing, tympanic membrane, external ear and ear canal normal.  Nose: Mucosal edema present. No sinus tenderness. Right sinus exhibits no maxillary sinus tenderness and no frontal sinus tenderness. Left sinus exhibits no maxillary sinus tenderness and no frontal sinus tenderness.  Mouth/Throat: Uvula is midline and mucous membranes are normal. Mucous membranes are not pale, not dry and not cyanotic. Posterior oropharyngeal erythema present. No oropharyngeal exudate, posterior oropharyngeal edema or tonsillar abscesses.  Cardiovascular: Normal rate and regular rhythm.   Respiratory: Effort normal and breath sounds normal. He has no wheezes. He has no rales.  GI: Soft. Bowel sounds are normal. There is no tenderness. There is no rigidity, no rebound, no guarding, no tenderness at McBurney's point and negative Murphy's sign.  Musculoskeletal: Normal range of motion.  Lymphadenopathy:    He has no cervical adenopathy.  Neurological: He is alert and oriented to person, place, and time. He has normal strength. He displays no atrophy, no tremor and normal reflexes. No cranial nerve deficit or sensory deficit. He exhibits normal muscle tone. He displays a negative Romberg sign. He displays no seizure activity. Gait normal. GCS eye subscore is 4. GCS verbal subscore is 5. GCS motor subscore is 6.  Skin: Skin is warm and dry. He is not diaphoretic.  Psychiatric: He has a normal mood and affect.    No results found for this or any previous visit (from the past 24 hour(s)).  ASSESSMENT & PLAN  Jari was seen today for abnormal labs.  Patient without symptoms today.  He receives a full panel of blood work each year ordered by an unknown provider and [facility] any abnormals, which is completely out of context to any symptoms.  His EKG is normal and does not reflect hyperkalemia.  He is  not a diabetic and does not take any BP medication.  Will recheck CMET and running a CK.  The hyperkalemia is most likely 2/2 hemolysis.  Patient is also taking opioids and is not taking a laxative. I advised that he start a miralax and metamucil regimen daily. Relayed this to patient via avs.   Diagnoses and all orders for this visit:  Hyperkalemia Orders: -     EKG 12-Lead -     Comprehensive metabolic panel -     CK    The patient was advised to call or come back to clinic if he does not see an improvement in symptoms, or worsens with the above plan.   Deliah BostonMichael Bethaney Oshana, MHS, PA-C [facility] 01/26/2015 2:01 PM

## 2015-01-27 LAB — COMPREHENSIVE METABOLIC PANEL
ALT: 21 U/L (ref 0–53)
AST: 14 U/L (ref 0–37)
Albumin: 4 g/dL (ref 3.5–5.2)
Alkaline Phosphatase: 53 U/L (ref 39–117)
BUN: 14 mg/dL (ref 6–23)
CO2: 26 meq/L (ref 19–32)
CREATININE: 0.9 mg/dL (ref 0.50–1.35)
Calcium: 9 mg/dL (ref 8.4–10.5)
Chloride: 107 mEq/L (ref 96–112)
GLUCOSE: 81 mg/dL (ref 70–99)
Potassium: 5 mEq/L (ref 3.5–5.3)
Sodium: 140 mEq/L (ref 135–145)
Total Bilirubin: 0.5 mg/dL (ref 0.2–1.2)
Total Protein: 6.8 g/dL (ref 6.0–8.3)

## 2015-01-27 LAB — CK: Total CK: 87 U/L (ref 7–232)

## 2015-01-28 ENCOUNTER — Encounter: Payer: Self-pay | Admitting: Family Medicine

## 2015-02-18 ENCOUNTER — Ambulatory Visit (INDEPENDENT_AMBULATORY_CARE_PROVIDER_SITE_OTHER): Payer: BLUE CROSS/BLUE SHIELD | Admitting: Physician Assistant

## 2015-02-18 ENCOUNTER — Ambulatory Visit (INDEPENDENT_AMBULATORY_CARE_PROVIDER_SITE_OTHER): Payer: BLUE CROSS/BLUE SHIELD

## 2015-02-18 VITALS — BP 122/74 | HR 81 | Temp 98.7°F | Resp 17 | Ht 62.5 in | Wt 123.0 lb

## 2015-02-18 DIAGNOSIS — S62639B Displaced fracture of distal phalanx of unspecified finger, initial encounter for open fracture: Secondary | ICD-10-CM | POA: Diagnosis not present

## 2015-02-18 DIAGNOSIS — S61319A Laceration without foreign body of unspecified finger with damage to nail, initial encounter: Secondary | ICD-10-CM | POA: Diagnosis not present

## 2015-02-18 DIAGNOSIS — M79645 Pain in left finger(s): Secondary | ICD-10-CM | POA: Diagnosis not present

## 2015-02-18 MED ORDER — CEPHALEXIN 500 MG PO CAPS: 500.0000 mg | ORAL_CAPSULE | Freq: Three times a day (TID) | ORAL | Status: DC

## 2015-02-18 NOTE — Patient Instructions (Signed)
WOUND CARE Please return in 2 days to have your wound checked. . Do not apply any ointments or creams to the wound while stitches/staples are in place, as this may cause delayed healing - keep the dressing in place unless it bleeds through. . Notify the office if you experience any of the following signs of infection: Swelling, redness, pus drainage, streaking, fever >101.0 F . Notify the office if you experience excessive bleeding that does not stop after 15-20 minutes of constant, firm pressure.

## 2015-02-18 NOTE — Progress Notes (Signed)
   Dianne DunSouphat Gilardi  MRN: [id number] DOB: 03/16/1951  Subjective:  Pt presents to clinic with his 4th finger on his left hand that was crushed at work 5 days ago (6/1).  He was lifting a piece of metal with his left hand and when he placed the metal down to weld it and caught his finger.  He thought it would be better but it is still oozing though the pain is better today.  He has kept a drsg on it.  He has taken no medications for it.  He is right handed.  His finger was smashed between 2 pieces of metal - he had gloves on and then gloves were not cut. Last tetanus 09/2014.  Outpatient Prescriptions Prior to Visit  Medication Sig Dispense Refill  .   30 tablet 0   No facility-administered medications prior to visit.   No Known Allergies  Review of Systems  Constitutional: Negative for fever and chills.   Objective:  Physical Exam  Constitutional: He is oriented to person, place, and time and well-developed, well-nourished, and in no distress.  BP 122/74 mmHg  Pulse 81  Temp(Src) 98.7 F (37.1 C) (Oral)  Resp 17  Ht 5' 2.5" (1.588 m)  Wt 123 lb (55.792 kg)  BMI 22.12 kg/m2  SpO2 98%   HENT:  Head: Normocephalic and atraumatic.  Right Ear: External ear normal.  Left Ear: External ear normal.  Eyes: Conjunctivae are normal.  Neck: Normal range of motion.  Pulmonary/Chest: Effort normal.  Musculoskeletal:       Hands: The are two lacerations from the pressure - the one located on the lateral aspect is well approximated and the laceration on the medial aspect has flesh protruding.  There is no surrounding erythema and no purulence expressed.  Neurological: He is alert and oriented to person, place, and time. Gait normal.  Skin: Skin is warm and dry.  Psychiatric: Mood, memory, affect and judgment normal.   [facility] reading (PRIMARY) by  Dr. Cleta Albertsaub.  Tufts fracture left 4th digit.  Procedure:  Consent obtained.  Metacarpal block with 1% lidocaine.  Finger cleaned and drap placed.  Nail  removed exposing a nail bed laceration that was repaired with 5-0 vicryl.  Xeroform gauze was placed. The medial laceration the protruding flesh was removed and a horizontal mattress suture with 5-0 Ethilon was loosely placed.  Drsg placed and fold over splint.  Assessment and Plan :  Finger pain, left - Plan: DG Finger Ring Left  Open fracture of tuft of distal phalanx of finger, initial encounter - Plan: cephALEXin (KEFLEX) 500 MG capsule  Laceration of nail bed of finger, initial encounter - Plan: cephALEXin (KEFLEX) 500 MG capsule   Pt has an open fracture and wounds that were repaired 5 days after they occurred.  He was placed on abx today and he will recheck in 48h to ensure that the wounds are healing correctly and there are no signs on infection.  Wound care was d/w pt and his questions were answered.    Benny LennertSarah Nemiah Bubar PA-C  [facility] 02/18/2015 10:04 AM

## 2015-02-20 ENCOUNTER — Ambulatory Visit (INDEPENDENT_AMBULATORY_CARE_PROVIDER_SITE_OTHER): Payer: BLUE CROSS/BLUE SHIELD | Admitting: Physician Assistant

## 2015-02-20 VITALS — BP 148/84 | HR 69 | Temp 98.6°F | Resp 17 | Ht 62.5 in | Wt 124.2 lb

## 2015-02-20 DIAGNOSIS — S61319A Laceration without foreign body of unspecified finger with damage to nail, initial encounter: Secondary | ICD-10-CM

## 2015-02-20 DIAGNOSIS — M79645 Pain in left finger(s): Secondary | ICD-10-CM

## 2015-02-20 DIAGNOSIS — S62639B Displaced fracture of distal phalanx of unspecified finger, initial encounter for open fracture: Secondary | ICD-10-CM

## 2015-02-20 MED ORDER — TRAMADOL HCL 50 MG PO TABS: 50.0000 mg | ORAL_TABLET | Freq: Every evening | ORAL | Status: DC | PRN

## 2015-02-20 NOTE — Progress Notes (Signed)
   Jorge Richards  MRN: [id number] DOB: 01/30/1951  Subjective:  Pt presents to clinic for a recheck of his finger injury.  He is taking tylenol which helps during the day but his finger throbs at night and he is having trouble sleeping at night. He is tolerating the abx ok.  Patient Active Problem List   Diagnosis Date Noted  . Bursitis, shoulder 08/15/2014  . Essential hypertension, benign 06/27/2014  . History of TB (tuberculosis)     Current Outpatient Prescriptions on File Prior to Visit  Medication Sig Dispense Refill  . cephALEXin (KEFLEX) 500 MG capsule Take 1 capsule (500 mg total) by mouth 3 (three) times daily. (Patient not taking: Reported on 02/20/2015) 21 capsule 0   No current facility-administered medications on file prior to visit.    No Known Allergies  Review of Systems  Constitutional: Negative for fever and chills.  Skin: Positive for wound.   Objective:  BP 148/84 mmHg  Pulse 69  Temp(Src) 98.6 F (37 C) (Oral)  Resp 17  Ht 5' 2.5" (1.588 m)  Wt 124 lb 3.2 oz (56.337 kg)  BMI 22.34 kg/m2  SpO2 98%  Physical Exam  Constitutional: He is oriented to person, place, and time and well-developed, well-nourished, and in no distress.  HENT:  Head: Normocephalic and atraumatic.  Right Ear: External ear normal.  Left Ear: External ear normal.  Eyes: Conjunctivae are normal.  Neck: Normal range of motion.  Pulmonary/Chest: Effort normal.  Neurological: He is alert and oriented to person, place, and time. Gait normal.  Skin: Skin is warm and dry.  Drsg soaked off.  Wound is almost healed.  No erythema surrounding the wound or nail.    Psychiatric: Mood, memory, affect and judgment normal.    Assessment and Plan :  Finger pain, left - Plan: traMADol (ULTRAM) 50 MG tablet  Open fracture of tuft of distal phalanx of finger, initial encounter - Plan: traMADol (ULTRAM) 50 MG tablet  Laceration of nail bed of finger, initial encounter  Continue keeping the  wound covered.  He still plans to talk to his work.  Suture removal next week.  Benny LennertSarah Weber PA-C  [facility] 02/20/2015 7:22 PM

## 2015-02-20 NOTE — Patient Instructions (Signed)
Keep covered.  Talk to work about this injury happening at work so they will cover your expenses.    6/16 after noon.

## 2015-02-28 ENCOUNTER — Ambulatory Visit (INDEPENDENT_AMBULATORY_CARE_PROVIDER_SITE_OTHER): Payer: BLUE CROSS/BLUE SHIELD | Admitting: Physician Assistant

## 2015-02-28 VITALS — BP 120/78 | HR 70 | Temp 98.8°F | Resp 18 | Ht 62.5 in | Wt 124.6 lb

## 2015-02-28 DIAGNOSIS — S61319D Laceration without foreign body of unspecified finger with damage to nail, subsequent encounter: Secondary | ICD-10-CM

## 2015-02-28 DIAGNOSIS — M79645 Pain in left finger(s): Secondary | ICD-10-CM

## 2015-02-28 DIAGNOSIS — S62639D Displaced fracture of distal phalanx of unspecified finger, subsequent encounter for fracture with routine healing: Secondary | ICD-10-CM

## 2015-02-28 DIAGNOSIS — Z4802 Encounter for removal of sutures: Secondary | ICD-10-CM

## 2015-02-28 NOTE — Progress Notes (Signed)
   Hikaru Sowerby  MRN: [id number] DOB: 26-Jun-1951  Subjective:  Pt presents to clinic for suture removal.  Pt seen on 02/18/15 for an open fracture of distal left ring finger phalanx, 1 horizontal mattress suture placed at the time. He has been on Keflex and using Tramadol for pain, tolerating both well. He initially only took tramadol at nights and is no longer taking it. He uses a splint on his finger only while at work and keeps it covered and dry. No signs or symptoms of infection, no fevers/ chills.  Patient Active Problem List   Diagnosis Date Noted  . Bursitis, shoulder 08/15/2014  . Essential hypertension, benign 06/27/2014  . History of TB (tuberculosis)     Current Outpatient Prescriptions on File Prior to Visit  Medication Sig Dispense Refill  . cephALEXin (KEFLEX) 500 MG capsule Take 1 capsule (500 mg total) by mouth 3 (three) times daily. 21 capsule 0  . traMADol (ULTRAM) 50 MG tablet Take 1 tablet (50 mg total) by mouth at bedtime as needed for moderate pain. 15 tablet 0   No current facility-administered medications on file prior to visit.    No Known Allergies  Review of Systems  Constitutional: Negative for fever and chills.  Skin: Positive for wound.   Objective:  BP 120/78 mmHg  Pulse 70  Temp(Src) 98.8 F (37.1 C) (Oral)  Resp 18  Ht 5' 2.5" (1.588 m)  Wt 124 lb 9.6 oz (56.518 kg)  BMI 22.41 kg/m2  SpO2 98%  Physical Exam  Constitutional: He is oriented to person, place, and time and well-developed, well-nourished, and in no distress.  HENT:  Head: Normocephalic and atraumatic.  Right Ear: External ear normal.  Left Ear: External ear normal.  Eyes: Conjunctivae are normal.  Neck: Normal range of motion.  Pulmonary/Chest: Effort normal.  Neurological: He is alert and oriented to person, place, and time. Gait normal.  Skin: Skin is warm and dry.  Well healed wound on left 4th digit - sutures removed by Ariel Hilsinger PA-S without difficulty.    Psychiatric: Mood, memory, affect and judgment normal.    Assessment and Plan :  Open fracture of tuft of distal phalanx of finger, initial encounter  Laceration of nail bed of finger, initial encounter  Finger pain, left   Well healed wound.  Pt will continue to wear the splint at work and f/u only if needed.  Benny Lennert PA-C  [facility] 02/28/2015 9:00 PM

## 2015-02-28 NOTE — Progress Notes (Signed)
Patient ID: Jorge Richards, male    DOB: Jun 10, 1951, 64 y.o.   MRN: [id number]  PCP: Tally Due, MD   Subjective:  HPI Pt is a 64 y/o male presenting for suture removal on his left ring finger.  Pt seen on 02/18/15 for an open fracture of distal left ring finger phalanx, 1 horizontal mattress suture placed at the time. He has been on Keflex and using Tramadol for pain, tolerating both well. He initially only took tramadol at nights and is no longer taking it. He uses a splint on his finger only while at work and keeps it covered and dry. No signs or symptoms of infection, no fevers/ chills.  Review of Systems  Constitutional: Negative.   Respiratory: Negative.   Cardiovascular: Negative.   Gastrointestinal: Negative.   Musculoskeletal: Negative.   Skin: Positive for wound (left ring finger). Negative for color change, pallor and rash.  Neurological: Negative.      Patient Active Problem List   Diagnosis Date Noted  . Bursitis, shoulder 08/15/2014  . Essential hypertension, benign 06/27/2014  . History of TB (tuberculosis)     Past Medical History  Diagnosis Date  . Hypertension   . TB (pulmonary tuberculosis) 03/2008    s/p INH    Prior to Admission medications   Medication Sig Start Date End Date Taking? Authorizing Provider  cephALEXin (KEFLEX) 500 MG capsule Take 1 capsule (500 mg total) by mouth 3 (three) times daily. 02/18/15  Yes Morrell Riddle, PA-C  traMADol (ULTRAM) 50 MG tablet Take 1 tablet (50 mg total) by mouth at bedtime as needed for moderate pain. 02/20/15  Yes Morrell Riddle, PA-C    No Known Allergies  Past Medical, Surgical Family and Social History reviewed and updated.   Objective:   Vitals: BP 120/78 mmHg  Pulse 70  Temp(Src) 98.8 F (37.1 C) (Oral)  Resp 18  Ht 5' 2.5" (1.588 m)  Wt 124 lb 9.6 oz (56.518 kg)  BMI 22.41 kg/m2  SpO2 98%   Physical Exam  Constitutional: He is oriented to person, place, and time. He appears  well-developed and well-nourished. He is cooperative. No distress.  HENT:  Head: Normocephalic and atraumatic.  Cardiovascular: Normal rate, regular rhythm and normal heart sounds.   Pulmonary/Chest: Effort normal and breath sounds normal. No respiratory distress.  Musculoskeletal:       Left hand: He exhibits tenderness (Distal left ring finger), bony tenderness (Distal left ring finger) and swelling (mild). He exhibits normal range of motion and no deformity.  Lymphadenopathy:       Head (right side): No submental, no submandibular, no tonsillar, no preauricular, no posterior auricular and no occipital adenopathy present.       Head (left side): No submental, no submandibular, no tonsillar, no preauricular, no posterior auricular and no occipital adenopathy present.  Neurological: He is alert and oriented to person, place, and time.  Skin: Skin is warm and dry. Laceration (distal left ring finger, healing well with 1 suture in place) noted. No bruising noted. No erythema.  Psychiatric: He has a normal mood and affect. His behavior is normal.    Assessment & Plan:   Marquise was seen today for suture / staple removal.  Diagnoses and all orders for this visit:  Open fracture of tuft of distal phalanx of finger, initial encounter Laceration of nail bed of finger, initial encounter   -  1 horizontal mattress suture removed.   -  No signs or symptoms  of infection.   -  Keep covered while at work, keep clean and dry.   -  Follow-up as needed.   Alister Staver, PA-S [facility] 02/28/2015 8:42 PM

## 2015-04-19 ENCOUNTER — Ambulatory Visit: Payer: Self-pay | Admitting: Internal Medicine

## 2015-05-13 ENCOUNTER — Encounter: Payer: Self-pay | Admitting: Internal Medicine

## 2015-05-13 ENCOUNTER — Ambulatory Visit (INDEPENDENT_AMBULATORY_CARE_PROVIDER_SITE_OTHER): Payer: BLUE CROSS/BLUE SHIELD | Admitting: Internal Medicine

## 2015-05-13 VITALS — BP 122/60 | HR 77 | Temp 98.7°F | Resp 12 | Ht 63.0 in | Wt 122.8 lb

## 2015-05-13 DIAGNOSIS — Z8611 Personal history of tuberculosis: Secondary | ICD-10-CM | POA: Diagnosis not present

## 2015-05-13 DIAGNOSIS — Z0000 Encounter for general adult medical examination without abnormal findings: Secondary | ICD-10-CM

## 2015-05-13 DIAGNOSIS — I10 Essential (primary) hypertension: Secondary | ICD-10-CM | POA: Diagnosis not present

## 2015-05-13 NOTE — Patient Instructions (Signed)
We will not check any blood work today. We can see you back next May for a physical.  If you have any new problems or questions please feel free to call us before then.

## 2015-05-13 NOTE — Progress Notes (Signed)
Pre visit review using our clinic review tool, if applicable. No additional management support is needed unless otherwise documented below in the visit note. 

## 2015-05-14 ENCOUNTER — Encounter: Payer: Self-pay | Admitting: Internal Medicine

## 2015-05-14 DIAGNOSIS — Z0000 Encounter for general adult medical examination without abnormal findings: Secondary | ICD-10-CM | POA: Insufficient documentation

## 2015-05-14 NOTE — Assessment & Plan Note (Signed)
BP normal off medicine and will observe for now.

## 2015-05-14 NOTE — Progress Notes (Signed)
   Subjective:    Patient ID: Jorge Richards, male    DOB: [birth year], 64 y.o.   MRN: [id number]  HPI The patient is a 64 YO man coming in new for wellness. He denies any complaints. Takes no medicines regularly. Has history of being treated for latent TB but has never had active disease.  PMH, [facility] social history reviewed and updated.   Review of Systems  Constitutional: Negative for fever, activity change, appetite change, fatigue and unexpected weight change.  HENT: Negative.   Eyes: Negative.   Respiratory: Negative for cough, chest tightness, shortness of breath and wheezing.   Cardiovascular: Negative for chest pain, palpitations and leg swelling.  Gastrointestinal: Negative for nausea, abdominal pain, diarrhea, constipation and abdominal distention.  Musculoskeletal: Positive for arthralgias.  Skin: Negative.   Neurological: Negative.   Psychiatric/Behavioral: Negative.       Objective:   Physical Exam  Constitutional: He is oriented to person, place, and time. He appears well-developed and well-nourished.  HENT:  Head: Normocephalic and atraumatic.  Eyes: EOM are normal.  Neck: Normal range of motion.  Cardiovascular: Normal rate and regular rhythm.   Pulmonary/Chest: Effort normal and breath sounds normal. No respiratory distress. He has no wheezes. He has no rales.  Abdominal: Soft. Bowel sounds are normal. He exhibits no distension. There is no tenderness. There is no rebound.  Musculoskeletal: He exhibits no edema.  Neurological: He is alert and oriented to person, place, and time. Coordination normal.  Skin: Skin is warm and dry.  Psychiatric: He has a normal mood and affect.   Filed Vitals:   05/13/15 1513  BP: 122/60  Pulse: 77  Temp: 98.7 F (37.1 C)  TempSrc: Oral  Resp: 12  Height:  (1.6 m)  Weight: 122 lb 12.8 oz (55.702 kg)  SpO2: 98%      Assessment & Plan:

## 2015-05-14 NOTE — Assessment & Plan Note (Signed)
Colonoscopy up to date, reviewed recent labs in the computer which are normal. Tetanus up to date. Talked to him about shingles vaccine which he does not want today. Does not want hepatitis C or HIV screening today and will reassess at next visit.

## 2015-05-14 NOTE — Assessment & Plan Note (Signed)
History of latent TB and no further treatment required.

## 2015-08-14 IMAGING — CR DG CHEST 2V
2 series · 2 of 2 positions shown · non-contrast
Comparison: 05/01/2008

CLINICAL DATA: Shortness of breath. History of tuberculosis.Skipped
beats 3SG.B (TAQ-DA-CM). Initial encounter.

EXAM:
CHEST  2 VIEW

[PA]
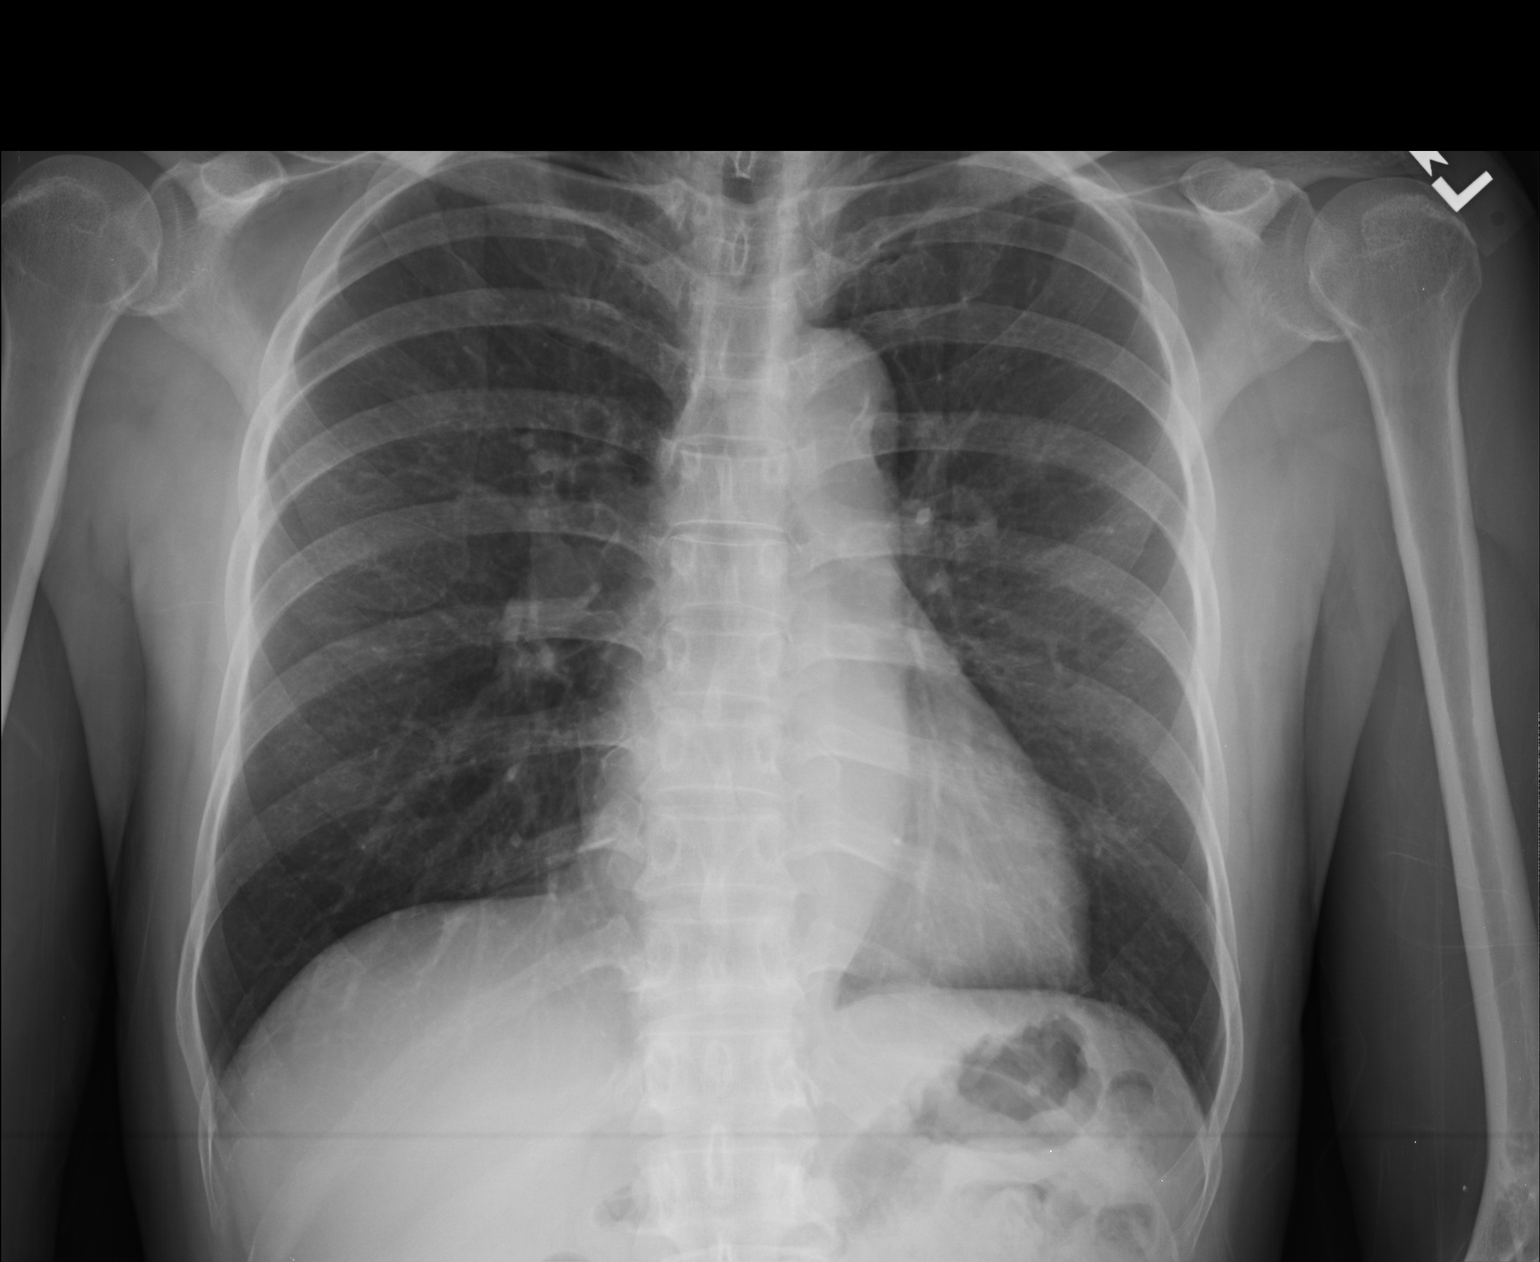

[lateral]
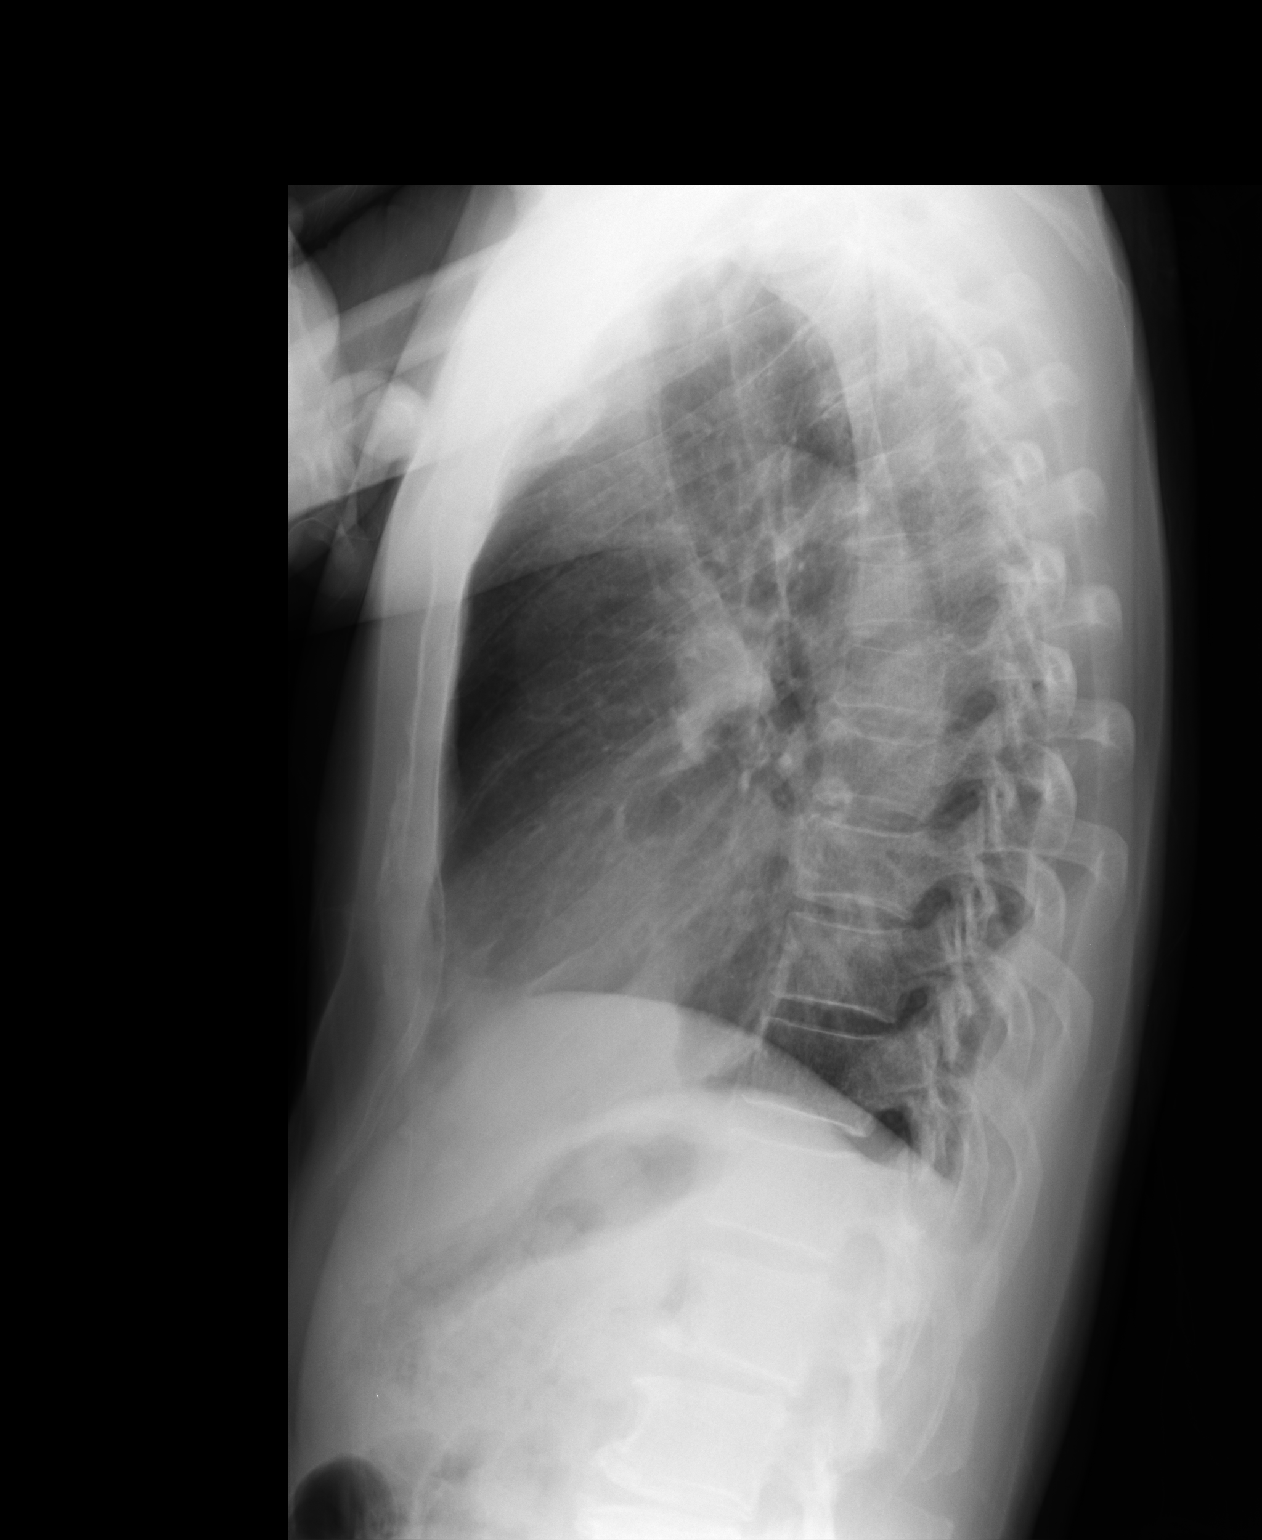

[2 of 2 positions shown; findings below may reference images not displayed]

FINDINGS: Lateral view degraded by patient arm position. Midline trachea.
Normal heart size. Atherosclerosis in the transverse aorta. Clear
lungs.
IMPRESSION: No acute cardiopulmonary disease.

Aortic atherosclerosis.

## 2016-03-03 ENCOUNTER — Ambulatory Visit: Payer: BLUE CROSS/BLUE SHIELD | Admitting: Internal Medicine

## 2016-03-06 ENCOUNTER — Encounter: Payer: Self-pay | Admitting: Internal Medicine

## 2016-03-06 ENCOUNTER — Other Ambulatory Visit (INDEPENDENT_AMBULATORY_CARE_PROVIDER_SITE_OTHER): Payer: BLUE CROSS/BLUE SHIELD

## 2016-03-06 ENCOUNTER — Ambulatory Visit (INDEPENDENT_AMBULATORY_CARE_PROVIDER_SITE_OTHER): Payer: BLUE CROSS/BLUE SHIELD | Admitting: Internal Medicine

## 2016-03-06 VITALS — BP 150/78 | HR 69 | Temp 98.5°F | Resp 18 | Ht 63.0 in | Wt 130.0 lb

## 2016-03-06 DIAGNOSIS — M7501 Adhesive capsulitis of right shoulder: Secondary | ICD-10-CM | POA: Diagnosis not present

## 2016-03-06 DIAGNOSIS — M7500 Adhesive capsulitis of unspecified shoulder: Secondary | ICD-10-CM | POA: Insufficient documentation

## 2016-03-06 DIAGNOSIS — I10 Essential (primary) hypertension: Secondary | ICD-10-CM

## 2016-03-06 DIAGNOSIS — R5383 Other fatigue: Secondary | ICD-10-CM

## 2016-03-06 LAB — CBC
HEMATOCRIT: 37.6 % — AB (ref 39.0–52.0)
Hemoglobin: 12.4 g/dL — ABNORMAL LOW (ref 13.0–17.0)
MCHC: 32.9 g/dL (ref 30.0–36.0)
MCV: 80.8 fl (ref 78.0–100.0)
Platelets: 273 10*3/uL (ref 150.0–400.0)
RBC: 4.66 Mil/uL (ref 4.22–5.81)
RDW: 14.3 % (ref 11.5–15.5)
WBC: 7.5 10*3/uL (ref 4.0–10.5)

## 2016-03-06 LAB — COMPREHENSIVE METABOLIC PANEL
ALK PHOS: 52 U/L (ref 39–117)
ALT: 18 U/L (ref 0–53)
AST: 17 U/L (ref 0–37)
Albumin: 3.8 g/dL (ref 3.5–5.2)
BILIRUBIN TOTAL: 0.3 mg/dL (ref 0.2–1.2)
BUN: 13 mg/dL (ref 6–23)
CO2: 29 mEq/L (ref 19–32)
Calcium: 8.9 mg/dL (ref 8.4–10.5)
Chloride: 103 mEq/L (ref 96–112)
Creatinine, Ser: 0.95 mg/dL (ref 0.40–1.50)
GFR: 85.12 mL/min (ref >60.00)
GLUCOSE: 127 mg/dL — AB (ref 70–99)
POTASSIUM: 3.8 meq/L (ref 3.5–5.1)
SODIUM: 138 meq/L (ref 135–145)
TOTAL PROTEIN: 6.6 g/dL (ref 6.0–8.3)

## 2016-03-06 LAB — T4, FREE: Free T4: 0.82 ng/dL (ref 0.60–1.60)

## 2016-03-06 LAB — TSH: TSH: 1.3 u[IU]/mL (ref 0.35–4.50)

## 2016-03-06 MED ORDER — AMLODIPINE BESYLATE 5 MG PO TABS: 5.0000 mg | ORAL_TABLET | Freq: Every day | ORAL | Status: DC

## 2016-03-06 MED ORDER — HYDROCODONE-ACETAMINOPHEN 5-325 MG PO TABS: 1.0000 | ORAL_TABLET | Freq: Two times a day (BID) | ORAL | Status: DC | PRN

## 2016-03-06 NOTE — Assessment & Plan Note (Addendum)
Rx for amlodipine 5 mg daily to help with mildly elevated BP.

## 2016-03-06 NOTE — Progress Notes (Signed)
   Subjective:    Patient ID: Jorge Richards, male    DOB: 08/26/1951, 65 y.o.   MRN: [id number]  HPI The patient is a 65 YO man coming in for shoulder pain and fatigue. He denies weight change, fevers, chills, chest pains, SOB, diarrhea, constipation. He is having shoulder pain from frozen shoulder from many years ago. Uses rare hydrocodone and is out. Filled last about 1 year ago.   Review of Systems  Constitutional: Positive for fatigue. Negative for fever, activity change, appetite change and unexpected weight change.  Respiratory: Negative for cough, chest tightness, shortness of breath and wheezing.   Cardiovascular: Negative for chest pain, palpitations and leg swelling.  Gastrointestinal: Negative for nausea, abdominal pain, diarrhea, constipation and abdominal distention.  Musculoskeletal: Positive for arthralgias.  Skin: Negative.   Neurological: Negative.   Psychiatric/Behavioral: Negative.       Objective:   Physical Exam  Constitutional: He is oriented to person, place, and time. He appears well-developed and well-nourished.  HENT:  Head: Normocephalic and atraumatic.  Eyes: EOM are normal.  Neck: Normal range of motion.  Cardiovascular: Normal rate and regular rhythm.   Pulmonary/Chest: Effort normal and breath sounds normal. No respiratory distress. He has no wheezes. He has no rales.  Abdominal: Soft. Bowel sounds are normal. He exhibits no distension. There is no tenderness. There is no rebound.  Musculoskeletal: He exhibits no edema.  Neurological: He is alert and oriented to person, place, and time. Coordination normal.  Skin: Skin is warm and dry.   Filed Vitals:   03/06/16 1331  BP: 150/78  Pulse: 69  Temp: 98.5 F (36.9 C)  TempSrc: Oral  Resp: 18  Height: 5\' 3"  (1.6 m)  Weight: 130 lb (58.968 kg)  SpO2: 98%      Assessment & Plan:

## 2016-03-06 NOTE — Assessment & Plan Note (Signed)
Rx for hydrocodone and encouraged to continue with stretching exercises.

## 2016-03-06 NOTE — Patient Instructions (Signed)
We have sent in the blood pressure medicine called amlodipine. It is 1 pill per day and you can take morning or evening whichever is easier.   We have refilled the pain medicine to use for the shoulder.   We are checking the blood work today and call back with the results looking for the cause of the tiredness and not feeling well.

## 2016-03-06 NOTE — Assessment & Plan Note (Signed)
No symptoms to indicate cause, no worrisome features. Checking CMP, CBC, TSH, free T4.

## 2016-03-06 NOTE — Progress Notes (Signed)
Pre visit review using our clinic review tool, if applicable. No additional management support is needed unless otherwise documented below in the visit note. 

## 2016-06-09 ENCOUNTER — Other Ambulatory Visit (INDEPENDENT_AMBULATORY_CARE_PROVIDER_SITE_OTHER): Payer: BLUE CROSS/BLUE SHIELD

## 2016-06-09 ENCOUNTER — Encounter: Payer: Self-pay | Admitting: Internal Medicine

## 2016-06-09 ENCOUNTER — Ambulatory Visit (INDEPENDENT_AMBULATORY_CARE_PROVIDER_SITE_OTHER): Payer: BLUE CROSS/BLUE SHIELD | Admitting: Internal Medicine

## 2016-06-09 VITALS — BP 114/64 | HR 74 | Temp 98.4°F | Resp 12 | Ht 63.0 in | Wt 131.0 lb

## 2016-06-09 DIAGNOSIS — Z0000 Encounter for general adult medical examination without abnormal findings: Secondary | ICD-10-CM

## 2016-06-09 DIAGNOSIS — I10 Essential (primary) hypertension: Secondary | ICD-10-CM | POA: Diagnosis not present

## 2016-06-09 LAB — COMPREHENSIVE METABOLIC PANEL
ALT: 12 U/L (ref 0–53)
AST: 13 U/L (ref 0–37)
Albumin: 4 g/dL (ref 3.5–5.2)
Alkaline Phosphatase: 54 U/L (ref 39–117)
BILIRUBIN TOTAL: 0.4 mg/dL (ref 0.2–1.2)
BUN: 15 mg/dL (ref 6–23)
CALCIUM: 8.8 mg/dL (ref 8.4–10.5)
CO2: 33 meq/L — AB (ref 19–32)
CREATININE: 1.01 mg/dL (ref 0.40–1.50)
Chloride: 104 mEq/L (ref 96–112)
GFR: 79.24 mL/min (ref >60.00)
Glucose, Bld: 72 mg/dL (ref 70–99)
Potassium: 5 mEq/L (ref 3.5–5.1)
Sodium: 139 mEq/L (ref 135–145)
Total Protein: 7.3 g/dL (ref 6.0–8.3)

## 2016-06-09 LAB — CBC
HCT: 38.3 % — ABNORMAL LOW (ref 39.0–52.0)
Hemoglobin: 12.9 g/dL — ABNORMAL LOW (ref 13.0–17.0)
MCHC: 33.6 g/dL (ref 30.0–36.0)
MCV: 80.9 fl (ref 78.0–100.0)
PLATELETS: 256 10*3/uL (ref 150.0–400.0)
RBC: 4.73 Mil/uL (ref 4.22–5.81)
RDW: 14 % (ref 11.5–15.5)
WBC: 8.2 10*3/uL (ref 4.0–10.5)

## 2016-06-09 LAB — LIPID PANEL
CHOL/HDL RATIO: 4
CHOLESTEROL: 198 mg/dL (ref 0–200)
HDL: 55.3 mg/dL (ref >39.00)
LDL Cholesterol: 112 mg/dL — ABNORMAL HIGH (ref 0–99)
NonHDL: 142.38
TRIGLYCERIDES: 152 mg/dL — AB (ref 0.0–149.0)
VLDL: 30.4 mg/dL (ref 0.0–40.0)

## 2016-06-09 NOTE — Patient Instructions (Signed)
We will check the labs today and have sent in the referral to the GI doctor for the colonoscopy follow up.   Health Maintenance, Male A healthy lifestyle and preventative care can promote health and wellness.  Maintain regular health, dental, and eye exams.  Eat a healthy diet. Foods like vegetables, fruits, whole grains, low-fat dairy products, and lean protein foods contain the nutrients you need and are low in calories. Decrease your intake of foods high in solid fats, added sugars, and salt. Get information about a proper diet from your health care provider, if necessary.  Regular physical exercise is one of the most important things you can do for your health. Most adults should get at least 150 minutes of moderate-intensity exercise (any activity that increases your heart rate and causes you to sweat) each week. In addition, most adults need muscle-strengthening exercises on 2 or more days a week.   Maintain a healthy weight. The body mass index (BMI) is a screening tool to identify possible weight problems. It provides an estimate of body fat based on height and weight. Your health care provider can find your BMI and can help you achieve or maintain a healthy weight. For males 20 years and older:  A BMI below 18.5 is considered underweight.  A BMI of 18.5 to 24.9 is normal.  A BMI of 25 to 29.9 is considered overweight.  A BMI of 30 and above is considered obese.  Maintain normal blood lipids and cholesterol by exercising and minimizing your intake of saturated fat. Eat a balanced diet with plenty of fruits and vegetables. Blood tests for lipids and cholesterol should begin at age 63 and be repeated every 5 years. If your lipid or cholesterol levels are high, you are over age 63, or you are at high risk for heart disease, you may need your cholesterol levels checked more frequently.Ongoing high lipid and cholesterol levels should be treated with medicines if diet and exercise are not  working.  If you smoke, find out from your health care provider how to quit. If you do not use tobacco, do not start.  Lung cancer screening is recommended for adults aged 55-80 years who are at high risk for developing lung cancer because of a history of smoking. A yearly low-dose CT scan of the lungs is recommended for people who have at least a 30-pack-year history of smoking and are current smokers or have quit within the past 15 years. A pack year of smoking is smoking an average of 1 pack of cigarettes a day for 1 year (for example, a 30-pack-year history of smoking could mean smoking 1 pack a day for 30 years or 2 packs a day for 15 years). Yearly screening should continue until the smoker has stopped smoking for at least 15 years. Yearly screening should be stopped for people who develop a health problem that would prevent them from having lung cancer treatment.  If you choose to drink alcohol, do not have more than 2 drinks per day. One drink is considered to be 12 oz (360 mL) of beer, 5 oz (150 mL) of wine, or 1.5 oz (45 mL) of liquor.  Avoid the use of street drugs. Do not share needles with anyone. Ask for help if you need support or instructions about stopping the use of drugs.  High blood pressure causes heart disease and increases the risk of stroke. High blood pressure is more likely to develop in:  People who have blood pressure in  the end of the normal range (100-139/85-89 mm Hg).  People who are overweight or obese.  People who are African American.  If you are 61-92 years of age, have your blood pressure checked every 3-5 years. If you are 102 years of age or older, have your blood pressure checked every year. You should have your blood pressure measured twice--once when you are at a hospital or clinic, and once when you are not at a hospital or clinic. Record the average of the two measurements. To check your blood pressure when you are not at a hospital or clinic, you can  use:  An automated blood pressure machine at a pharmacy.  A home blood pressure monitor.  If you are 81-32 years old, ask your health care provider if you should take aspirin to prevent heart disease.  Diabetes screening involves taking a blood sample to check your fasting blood sugar level. This should be done once every 3 years after age 66 if you are at a normal weight and without risk factors for diabetes. Testing should be considered at a younger age or be carried out more frequently if you are overweight and have at least 1 risk factor for diabetes.  Colorectal cancer can be detected and often prevented. Most routine colorectal cancer screening begins at the age of 17 and continues through age 51. However, your health care provider may recommend screening at an earlier age if you have risk factors for colon cancer. On a yearly basis, your health care provider may provide home test kits to check for hidden blood in the stool. A small camera at the end of a tube may be used to directly examine the colon (sigmoidoscopy or colonoscopy) to detect the earliest forms of colorectal cancer. Talk to your health care provider about this at age 61 when routine screening begins. A direct exam of the colon should be repeated every 5-10 years through age 43, unless early forms of precancerous polyps or small growths are found.  People who are at an increased risk for hepatitis B should be screened for this virus. You are considered at high risk for hepatitis B if:  You were born in a country where hepatitis B occurs often. Talk with your health care provider about which countries are considered high risk.  Your parents were born in a high-risk country and you have not received a shot to protect against hepatitis B (hepatitis B vaccine).  You have HIV or AIDS.  You use needles to inject street drugs.  You live with, or have sex with, someone who has hepatitis B.  You are a man who has sex with other  men (MSM).  You get hemodialysis treatment.  You take certain medicines for conditions like cancer, organ transplantation, and autoimmune conditions.  Hepatitis C blood testing is recommended for all people born from [birth year] through 1963 and any individual with known risk factors for hepatitis C.  Healthy men should no longer receive prostate-specific antigen (PSA) blood tests as part of routine cancer screening. Talk to your health care provider about prostate cancer screening.  Testicular cancer screening is not recommended for adolescents or adult males who have no symptoms. Screening includes self-exam, a health care provider exam, and other screening tests. Consult with your health care provider about any symptoms you have or any concerns you have about testicular cancer.  Practice safe sex. Use condoms and avoid high-risk sexual practices to reduce the spread of sexually transmitted infections (STIs).  You  should be screened for STIs, including gonorrhea and chlamydia if:  You are sexually active and are younger than 24 years.  You are older than 24 years, and your health care provider tells you that you are at risk for this type of infection.  Your sexual activity has changed since you were last screened, and you are at an increased risk for chlamydia or gonorrhea. Ask your health care provider if you are at risk.  If you are at risk of being infected with HIV, it is recommended that you take a prescription medicine daily to prevent HIV infection. This is called pre-exposure prophylaxis (PrEP). You are considered at risk if:  You are a man who has sex with other men (MSM).  You are a heterosexual man who is sexually active with multiple partners.  You take drugs by injection.  You are sexually active with a partner who has HIV.  Talk with your health care provider about whether you are at high risk of being infected with HIV. If you choose to begin PrEP, you should first be tested  for HIV. You should then be tested every 3 months for as long as you are taking PrEP.  Use sunscreen. Apply sunscreen liberally and repeatedly throughout the day. You should seek shade when your shadow is shorter than you. Protect yourself by wearing long sleeves, pants, a wide-brimmed hat, and sunglasses year round whenever you are outdoors.  Tell your health care provider of new moles or changes in moles, especially if there is a change in shape or color. Also, tell your health care provider if a mole is larger than the size of a pencil eraser.  A one-time screening for abdominal aortic aneurysm (AAA) and surgical repair of large AAAs by ultrasound is recommended for men aged 49-75 years who are current or former smokers.  Stay current with your vaccines (immunizations).   This information is not intended to replace advice given to you by your health care provider. Make sure you discuss any questions you have with your health care provider.   Document Released: 02/27/2008 Document Revised: 09/21/2014 Document Reviewed: 01/26/2011 Elsevier Interactive Patient Education [employer].

## 2016-06-09 NOTE — Progress Notes (Signed)
   Subjective:    Patient ID: Jorge Richards, male    DOB: 01/13/1951, 65 y.o.   MRN: [id number]  HPI The patient is a 65 YO man coming in for wellness. No new complaints. Hearing poor left ear.   [facility] reviewed and updated.   Review of Systems  Constitutional: Negative for activity change, appetite change, fatigue, fever and unexpected weight change.  HENT: Negative.   Eyes: Negative.   Respiratory: Negative for cough, chest tightness, shortness of breath and wheezing.   Cardiovascular: Negative for chest pain, palpitations and leg swelling.  Gastrointestinal: Negative for abdominal distention, abdominal pain, constipation, diarrhea and nausea.  Musculoskeletal: Positive for arthralgias.  Skin: Negative.   Neurological: Negative.   Psychiatric/Behavioral: Negative.       Objective:   Physical Exam  Constitutional: He is oriented to person, place, and time. He appears well-developed and well-nourished.  HENT:  Head: Normocephalic and atraumatic.  Eyes: EOM are normal.  Neck: Normal range of motion.  Cardiovascular: Normal rate and regular rhythm.   Pulmonary/Chest: Effort normal and breath sounds normal. No respiratory distress. He has no wheezes. He has no rales.  Abdominal: Soft. Bowel sounds are normal. He exhibits no distension. There is no tenderness. There is no rebound.  Musculoskeletal: He exhibits no edema.  Neurological: He is alert and oriented to person, place, and time. Coordination normal.  Skin: Skin is warm and dry.  Psychiatric: He has a normal mood and affect.   Vitals:   06/09/16 1439  BP: 114/64  Pulse: 74  Resp: 12  Temp: 98.4 F (36.9 C)  TempSrc: Oral  SpO2: 98%  Weight: 131 lb (59.4 kg)  Height: 5\' 3"  (1.6 m)     Assessment & Plan:

## 2016-06-09 NOTE — Progress Notes (Signed)
Pre visit review using our clinic review tool, if applicable. No additional management support is needed unless otherwise documented below in the visit note. 

## 2016-06-09 NOTE — Assessment & Plan Note (Signed)
Referral to GI for colonoscopy (daughter thinks he is due we do not have old records), declines any immunizations today. Agrees to hep c screening. Counseled on exercise and diet. Counseled about sun safety and mole surveillance. Given screening recommendations.

## 2016-06-09 NOTE — Assessment & Plan Note (Addendum)
BP at goal, taking amlodipine 5 mg daily. No side effects. Checking CMP and adjust as needed.

## 2016-06-10 LAB — HEPATITIS C ANTIBODY: HCV Ab: NEGATIVE

## 2016-07-24 ENCOUNTER — Encounter: Payer: Self-pay | Admitting: Internal Medicine

## 2017-02-26 ENCOUNTER — Other Ambulatory Visit: Payer: Self-pay | Admitting: Internal Medicine

## 2017-06-29 ENCOUNTER — Encounter: Payer: Self-pay | Admitting: Internal Medicine

## 2017-06-29 ENCOUNTER — Other Ambulatory Visit (INDEPENDENT_AMBULATORY_CARE_PROVIDER_SITE_OTHER): Payer: BLUE CROSS/BLUE SHIELD

## 2017-06-29 ENCOUNTER — Ambulatory Visit (INDEPENDENT_AMBULATORY_CARE_PROVIDER_SITE_OTHER): Payer: BLUE CROSS/BLUE SHIELD | Admitting: Internal Medicine

## 2017-06-29 VITALS — BP 116/70 | HR 74 | Temp 98.6°F | Ht 63.0 in | Wt 130.0 lb

## 2017-06-29 DIAGNOSIS — Z0000 Encounter for general adult medical examination without abnormal findings: Secondary | ICD-10-CM

## 2017-06-29 DIAGNOSIS — I10 Essential (primary) hypertension: Secondary | ICD-10-CM

## 2017-06-29 LAB — COMPREHENSIVE METABOLIC PANEL
ALBUMIN: 4 g/dL (ref 3.5–5.2)
ALK PHOS: 54 U/L (ref 39–117)
ALT: 13 U/L (ref 0–53)
AST: 15 U/L (ref 0–37)
BILIRUBIN TOTAL: 0.3 mg/dL (ref 0.2–1.2)
BUN: 19 mg/dL (ref 6–23)
CO2: 28 mEq/L (ref 19–32)
CREATININE: 0.98 mg/dL (ref 0.40–1.50)
Calcium: 8.9 mg/dL (ref 8.4–10.5)
Chloride: 105 mEq/L (ref 96–112)
GFR: 81.77 mL/min (ref >60.00)
GLUCOSE: 96 mg/dL (ref 70–99)
Potassium: 4.5 mEq/L (ref 3.5–5.1)
SODIUM: 139 meq/L (ref 135–145)
TOTAL PROTEIN: 7 g/dL (ref 6.0–8.3)

## 2017-06-29 LAB — CBC
HCT: 39.7 % (ref 39.0–52.0)
Hemoglobin: 12.9 g/dL — ABNORMAL LOW (ref 13.0–17.0)
MCHC: 32.4 g/dL (ref 30.0–36.0)
MCV: 83.6 fl (ref 78.0–100.0)
PLATELETS: 227 10*3/uL (ref 150.0–400.0)
RBC: 4.74 Mil/uL (ref 4.22–5.81)
RDW: 13.9 % (ref 11.5–15.5)
WBC: 6.8 10*3/uL (ref 4.0–10.5)

## 2017-06-29 LAB — LIPID PANEL
CHOL/HDL RATIO: 4
CHOLESTEROL: 205 mg/dL — AB (ref 0–200)
HDL: 54.8 mg/dL (ref >39.00)
LDL Cholesterol: 118 mg/dL — ABNORMAL HIGH (ref 0–99)
NonHDL: 150.54
TRIGLYCERIDES: 165 mg/dL — AB (ref 0.0–149.0)
VLDL: 33 mg/dL (ref 0.0–40.0)

## 2017-06-29 NOTE — Patient Instructions (Signed)
We are checking the labs today.    Health Maintenance, Male A healthy lifestyle and preventive care is important for your health and wellness. Ask your health care provider about what schedule of regular examinations is right for you. What should I know about weight and diet? Eat a Healthy Diet  Eat plenty of vegetables, fruits, whole grains, low-fat dairy products, and lean protein.  Do not eat a lot of foods high in solid fats, added sugars, or salt.  Maintain a Healthy Weight Regular exercise can help you achieve or maintain a healthy weight. You should:  Do at least 150 minutes of exercise each week. The exercise should increase your heart rate and make you sweat (moderate-intensity exercise).  Do strength-training exercises at least twice a week.  Watch Your Levels of Cholesterol and Blood Lipids  Have your blood tested for lipids and cholesterol every 5 years starting at 64 years of age. If you are at high risk for heart disease, you should start having your blood tested when you are 64 years old. You may need to have your cholesterol levels checked more often if: ? Your lipid or cholesterol levels are high. ? You are older than 64 years of age. ? You are at high risk for heart disease.  What should I know about cancer screening? Many types of cancers can be detected early and may often be prevented. Lung Cancer  You should be screened every year for lung cancer if: ? You are a current smoker who has smoked for at least 30 years. ? You are a former smoker who has quit within the past 15 years.  Talk to your health care provider about your screening options, when you should start screening, and how often you should be screened.  Colorectal Cancer  Routine colorectal cancer screening usually begins at 64 years of age and should be repeated every 5-10 years until you are 64 years old. You may need to be screened more often if early forms of precancerous polyps or small  growths are found. Your health care provider may recommend screening at an earlier age if you have risk factors for colon cancer.  Your health care provider may recommend using home test kits to check for hidden blood in the stool.  A small camera at the end of a tube can be used to examine your colon (sigmoidoscopy or colonoscopy). This checks for the earliest forms of colorectal cancer.  Prostate and Testicular Cancer  Depending on your age and overall health, your health care provider may do certain tests to screen for prostate and testicular cancer.  Talk to your health care provider about any symptoms or concerns you have about testicular or prostate cancer.  Skin Cancer  Check your skin from head to toe regularly.  Tell your health care provider about any new moles or changes in moles, especially if: ? There is a change in a mole's size, shape, or color. ? You have a mole that is larger than a pencil eraser.  Always use sunscreen. Apply sunscreen liberally and repeat throughout the day.  Protect yourself by wearing long sleeves, pants, a wide-brimmed hat, and sunglasses when outside.  What should I know about heart disease, diabetes, and high blood pressure?  If you are 18-39 years of age, have your blood pressure checked every 3-5 years. If you are 40 years of age or older, have your blood pressure checked every year. You should have your blood pressure measured twice-once when you   are at a hospital or clinic, and once when you are not at a hospital or clinic. Record the average of the two measurements. To check your blood pressure when you are not at a hospital or clinic, you can use: ? An automated blood pressure machine at a pharmacy. ? A home blood pressure monitor.  Talk to your health care provider about your target blood pressure.  If you are between 45-79 years old, ask your health care provider if you should take aspirin to prevent heart disease.  Have regular  diabetes screenings by checking your fasting blood sugar level. ? If you are at a normal weight and have a low risk for diabetes, have this test once every three years after the age of 45. ? If you are overweight and have a high risk for diabetes, consider being tested at a younger age or more often.  A one-time screening for abdominal aortic aneurysm (AAA) by ultrasound is recommended for men aged 65-75 years who are current or former smokers. What should I know about preventing infection? Hepatitis B If you have a higher risk for hepatitis B, you should be screened for this virus. Talk with your health care provider to find out if you are at risk for hepatitis B infection. Hepatitis C Blood testing is recommended for:  Everyone born from 1945 through 1965.  Anyone with known risk factors for hepatitis C.  Sexually Transmitted Diseases (STDs)  You should be screened each year for STDs including gonorrhea and chlamydia if: ? You are sexually active and are younger than 64 years of age. ? You are older than 64 years of age and your health care provider tells you that you are at risk for this type of infection. ? Your sexual activity has changed since you were last screened and you are at an increased risk for chlamydia or gonorrhea. Ask your health care provider if you are at risk.  Talk with your health care provider about whether you are at high risk of being infected with HIV. Your health care provider may recommend a prescription medicine to help prevent HIV infection.  What else can I do?  Schedule regular health, dental, and eye exams.  Stay current with your vaccines (immunizations).  Do not use any tobacco products, such as cigarettes, chewing tobacco, and e-cigarettes. If you need help quitting, ask your health care provider.  Limit alcohol intake to no more than 2 drinks per day. One drink equals 12 ounces of beer, 5 ounces of wine, or 1 ounces of hard liquor.  Do not use  street drugs.  Do not share needles.  Ask your health care provider for help if you need support or information about quitting drugs.  Tell your health care provider if you often feel depressed.  Tell your health care provider if you have ever been abused or do not feel safe at home. This information is not intended to replace advice given to you by your health care provider. Make sure you discuss any questions you have with your health care provider. Document Released: 02/27/2008 Document Revised: 04/29/2016 Document Reviewed: 06/04/2015 Elsevier Interactive Patient Education  2018 [employer]  

## 2017-06-29 NOTE — Progress Notes (Signed)
   Subjective:    Patient ID: Jorge Richards, male    DOB: 1951/08/05, 66 y.o.   MRN: [id number]  HPI The patient is a 66 YO man coming in for physical.   PMH, [facility], social history reviewed and updated.   Review of Systems  Constitutional: Negative.   HENT: Negative.   Eyes: Negative.   Respiratory: Negative for cough, chest tightness and shortness of breath.   Cardiovascular: Negative for chest pain, palpitations and leg swelling.  Gastrointestinal: Negative for abdominal distention, abdominal pain, constipation, diarrhea, nausea and vomiting.  Musculoskeletal: Negative.   Skin: Negative.   Neurological: Negative.   Psychiatric/Behavioral: Negative.       Objective:   Physical Exam  Constitutional: He is oriented to person, place, and time. He appears well-developed and well-nourished.  HENT:  Head: Normocephalic and atraumatic.  Eyes: EOM are normal.  Neck: Normal range of motion.  Cardiovascular: Normal rate and regular rhythm.   Pulmonary/Chest: Effort normal and breath sounds normal. No respiratory distress. He has no wheezes. He has no rales.  Abdominal: Soft. Bowel sounds are normal. He exhibits no distension. There is no tenderness. There is no rebound.  Musculoskeletal: He exhibits no edema.  Neurological: He is alert and oriented to person, place, and time. Coordination normal.  Skin: Skin is warm and dry.  Psychiatric: He has a normal mood and affect.   Vitals:   06/29/17 1402  BP: 116/70  Pulse: 74  Temp: 98.6 F (37 C)  TempSrc: Oral  SpO2: 100%  Weight: 130 lb (59 kg)  Height:  (1.6 m)      Assessment & Plan:

## 2017-06-30 ENCOUNTER — Encounter: Payer: Self-pay | Admitting: Internal Medicine

## 2017-06-30 LAB — HEPATITIS C ANTIBODY
Hepatitis C Ab: NONREACTIVE
SIGNAL TO CUT-OFF: 0.04 (ref <1.00)

## 2017-06-30 NOTE — Assessment & Plan Note (Signed)
BP at goal on amlodipine 5 mg daily. Checking CMP and adjust as needed.  

## 2017-06-30 NOTE — Assessment & Plan Note (Signed)
Declines all immunizations, colonoscopy counseling given. Counseled about sun safety and mole surveillance. Given screening recommendations.

## 2017-08-29 ENCOUNTER — Other Ambulatory Visit: Payer: Self-pay | Admitting: Internal Medicine

## 2017-12-01 ENCOUNTER — Encounter: Payer: Self-pay | Admitting: Internal Medicine

## 2017-12-01 ENCOUNTER — Ambulatory Visit: Payer: BLUE CROSS/BLUE SHIELD | Admitting: Internal Medicine

## 2017-12-01 DIAGNOSIS — H9192 Unspecified hearing loss, left ear: Secondary | ICD-10-CM | POA: Diagnosis not present

## 2017-12-01 MED ORDER — NEOMYCIN-POLYMYXIN-HC 3.5-10000-1 OT SOLN: 3.0000 [drp] | Freq: Three times a day (TID) | OTIC | 0 refills | Status: AC

## 2017-12-01 NOTE — Progress Notes (Signed)
   Subjective:    Patient ID: Jorge Richards, male    DOB: 10/08/1950, 67 y.o.   MRN: [id number]  HPI The patient is a 67 YO man coming in for left sided hearing loss. Going on for several months and overall worsening. They have tried cleaning out his ear which did not help. If he covers the right ear he cannot hear. Denies recent loud noise exposure but does work in a loud environment and sometimes forgets his ear plugs.   Review of Systems  Constitutional: Negative for activity change, appetite change, chills, fatigue, fever and unexpected weight change.  HENT: Positive for hearing loss. Negative for ear discharge, ear pain, postnasal drip, rhinorrhea, sinus pressure, sinus pain, sneezing, sore throat, tinnitus, trouble swallowing and voice change.   Eyes: Negative.   Respiratory: Negative for chest tightness, shortness of breath and wheezing.   Cardiovascular: Negative.   Gastrointestinal: Negative.   Neurological: Negative.       Objective:   Physical Exam  Constitutional: He is oriented to person, place, and time. He appears well-developed and well-nourished.  HENT:  Head: Normocephalic and atraumatic.  No ear wax. With tuning fork he is able to hear 512 hz bone conduction and then air conduction once bone conduction stops which is appropriate  Eyes: EOM are normal.  Neck: Normal range of motion.  Cardiovascular: Normal rate and regular rhythm.  Pulmonary/Chest: Effort normal and breath sounds normal. No respiratory distress. He has no wheezes. He has no rales.  Abdominal: Soft. Bowel sounds are normal. He exhibits no distension. There is no tenderness. There is no rebound.  Musculoskeletal: He exhibits no edema.  Neurological: He is alert and oriented to person, place, and time. Coordination normal.  Skin: Skin is warm and dry.   Vitals:   12/01/17 0908  BP: 120/78  Pulse: 79  Temp: 98.1 F (36.7 C)  TempSrc: Oral  SpO2: 96%  Weight: 135 lb (61.2 kg)  Height: 5\' 3"  (1.6 m)       Assessment & Plan:

## 2017-12-01 NOTE — Assessment & Plan Note (Signed)
Suspect high frequency hearing loss and will refer to ENT per their request. No wax on exam.

## 2017-12-01 NOTE — Patient Instructions (Signed)
We have sent in the ear drops to use 3 drops 3 times per day for 3 days.   We will get you in with the hearing specialist.

## 2017-12-06 ENCOUNTER — Encounter: Payer: Self-pay | Admitting: Internal Medicine

## 2017-12-15 ENCOUNTER — Other Ambulatory Visit: Payer: Self-pay | Admitting: Otolaryngology

## 2017-12-15 DIAGNOSIS — IMO0001 Reserved for inherently not codable concepts without codable children: Secondary | ICD-10-CM

## 2017-12-15 DIAGNOSIS — H918X2 Other specified hearing loss, left ear: Secondary | ICD-10-CM

## 2017-12-15 DIAGNOSIS — H903 Sensorineural hearing loss, bilateral: Secondary | ICD-10-CM

## 2018-09-01 ENCOUNTER — Other Ambulatory Visit: Payer: Self-pay | Admitting: Internal Medicine

## 2018-09-09 ENCOUNTER — Other Ambulatory Visit: Payer: Self-pay | Admitting: Internal Medicine

## 2018-09-15 ENCOUNTER — Other Ambulatory Visit: Payer: Self-pay | Admitting: Internal Medicine

## 2018-09-15 MED ORDER — AMLODIPINE BESYLATE 5 MG PO TABS: 5.0000 mg | ORAL_TABLET | Freq: Every day | ORAL | 3 refills | Status: DC

## 2018-09-15 NOTE — Telephone Encounter (Signed)
Copied from CRM [id number]. Topic: Quick Communication - Rx Refill/Question >> Sep 15, 2018 10:53 AM Lynne Logan D wrote: Medication: amLODipine (NORVASC) 5 MG tablet   Has the patient contacted their pharmacy? Yes.   (Agent: If no, request that the patient contact the pharmacy for the refill.) (Agent: If yes, when and what did the pharmacy advise?)  Preferred Pharmacy (with phone number or street name): [facility] [phone] [place], [place] - [address] AT [place] [address] [phone] (Phone) [phone] (Fax)    Agent: Please be advised that RX refills may take up to 3 business days. We ask that you follow-up with your pharmacy.

## 2018-09-15 NOTE — Telephone Encounter (Signed)
Left message for pt. To call and make an appointment.

## 2018-09-20 ENCOUNTER — Other Ambulatory Visit: Payer: Self-pay | Admitting: Internal Medicine

## 2018-09-20 MED ORDER — AMLODIPINE BESYLATE 5 MG PO TABS: 5.0000 mg | ORAL_TABLET | Freq: Every day | ORAL | 3 refills | Status: AC

## 2019-03-24 ENCOUNTER — Ambulatory Visit (INDEPENDENT_AMBULATORY_CARE_PROVIDER_SITE_OTHER): Payer: Medicare Other | Admitting: Internal Medicine

## 2019-03-24 ENCOUNTER — Other Ambulatory Visit: Payer: Self-pay

## 2019-03-24 ENCOUNTER — Encounter: Payer: Self-pay | Admitting: Internal Medicine

## 2019-03-24 DIAGNOSIS — L249 Irritant contact dermatitis, unspecified cause: Secondary | ICD-10-CM | POA: Diagnosis not present

## 2019-03-24 DIAGNOSIS — L259 Unspecified contact dermatitis, unspecified cause: Secondary | ICD-10-CM | POA: Insufficient documentation

## 2019-03-24 MED ORDER — TRIAMCINOLONE ACETONIDE 0.1 % EX CREA: 1.0000 "application " | TOPICAL_CREAM | Freq: Two times a day (BID) | CUTANEOUS | 0 refills | Status: AC

## 2019-03-24 NOTE — Assessment & Plan Note (Signed)
Rx triamcinolone ointment and avoid scratching.

## 2019-03-24 NOTE — Patient Instructions (Signed)
We have sent in the cream to use twice a day.   Try to avoid scratching.

## 2019-03-24 NOTE — Progress Notes (Signed)
   Subjective:   Patient ID: Jorge Richards, male    DOB: 1951-05-01, 68 y.o.   MRN: [id number]  HPI The patient is a 68 YO man coming in for rash on arms and neck. Started about 1-2 weeks ago. Overall it is worsening some. He is scratching some due to itching. Does a lot of gardening and has never had problems with this before. The rash is limited to exposed skin areas. Denies fevers or chills. Denies swelling or SOB. Has tried calamine and anti-itch creams which have not helped.  Review of Systems  Constitutional: Negative.   Respiratory: Negative for cough, chest tightness and shortness of breath.   Cardiovascular: Negative for chest pain, palpitations and leg swelling.  Gastrointestinal: Negative for abdominal distention, abdominal pain, constipation, diarrhea, nausea and vomiting.  Musculoskeletal: Negative.   Skin: Positive for rash.  Neurological: Negative.   Psychiatric/Behavioral: Negative.     Objective:  Physical Exam Constitutional:      Appearance: He is well-developed.  HENT:     Head: Normocephalic and atraumatic.  Neck:     Musculoskeletal: Normal range of motion.  Cardiovascular:     Rate and Rhythm: Normal rate and regular rhythm.  Pulmonary:     Effort: Pulmonary effort is normal. No respiratory distress.     Breath sounds: Normal breath sounds. No wheezing or rales.  Abdominal:     General: Bowel sounds are normal. There is no distension.     Palpations: Abdomen is soft.     Tenderness: There is no abdominal tenderness. There is no rebound.  Skin:    General: Skin is warm and dry.     Findings: Rash present.     Comments: Rash on the lower arms and back of neck with stigmata of scratching, not consistent with poison ivy or cellulitis  Neurological:     Mental Status: He is alert and oriented to person, place, and time.     Coordination: Coordination normal.     Vitals:   03/24/19 1036  BP: 118/70  Pulse: 72  Temp: 98.3 F (36.8 C)  TempSrc: Oral   SpO2: 98%  Weight: 134 lb (60.8 kg)  Height: 5\' 3"  (1.6 m)    Assessment & Plan:

## 2019-12-12 ENCOUNTER — Other Ambulatory Visit: Payer: Self-pay | Admitting: Internal Medicine

## 2022-08-03 DIAGNOSIS — E785 Hyperlipidemia, unspecified: Secondary | ICD-10-CM | POA: Diagnosis not present

## 2022-08-03 DIAGNOSIS — I10 Essential (primary) hypertension: Secondary | ICD-10-CM | POA: Diagnosis not present

## 2022-08-03 DIAGNOSIS — E119 Type 2 diabetes mellitus without complications: Secondary | ICD-10-CM | POA: Diagnosis not present

## 2022-08-03 DIAGNOSIS — E559 Vitamin D deficiency, unspecified: Secondary | ICD-10-CM | POA: Diagnosis not present

## 2022-08-03 DIAGNOSIS — R739 Hyperglycemia, unspecified: Secondary | ICD-10-CM | POA: Diagnosis not present

## 2022-08-03 DIAGNOSIS — R972 Elevated prostate specific antigen [PSA]: Secondary | ICD-10-CM | POA: Diagnosis not present

## 2022-08-03 DIAGNOSIS — Z125 Encounter for screening for malignant neoplasm of prostate: Secondary | ICD-10-CM | POA: Diagnosis not present

## 2023-11-29 DIAGNOSIS — R051 Acute cough: Secondary | ICD-10-CM | POA: Diagnosis not present

## 2023-12-02 DIAGNOSIS — J209 Acute bronchitis, unspecified: Secondary | ICD-10-CM | POA: Diagnosis not present

## 2023-12-02 DIAGNOSIS — E559 Vitamin D deficiency, unspecified: Secondary | ICD-10-CM | POA: Diagnosis not present

## 2023-12-02 DIAGNOSIS — E785 Hyperlipidemia, unspecified: Secondary | ICD-10-CM | POA: Diagnosis not present

## 2023-12-02 DIAGNOSIS — R3912 Poor urinary stream: Secondary | ICD-10-CM | POA: Diagnosis not present

## 2023-12-02 DIAGNOSIS — R7301 Impaired fasting glucose: Secondary | ICD-10-CM | POA: Diagnosis not present
